# Patient Record
Sex: Female | Born: 1960 | Race: White | Hispanic: No
Health system: Southern US, Community
[De-identification: ages and names within clinical notes are randomized; demographics above are authoritative.]

## PROBLEM LIST (undated history)

## (undated) DIAGNOSIS — I609 Nontraumatic subarachnoid hemorrhage, unspecified: Secondary | ICD-10-CM

## (undated) DIAGNOSIS — I482 Chronic atrial fibrillation, unspecified: Secondary | ICD-10-CM

## (undated) DIAGNOSIS — J449 Chronic obstructive pulmonary disease, unspecified: Secondary | ICD-10-CM

## (undated) DIAGNOSIS — J9621 Acute and chronic respiratory failure with hypoxia: Secondary | ICD-10-CM

## (undated) DIAGNOSIS — I69391 Dysphagia following cerebral infarction: Secondary | ICD-10-CM

---

## 2018-02-09 ENCOUNTER — Other Ambulatory Visit (HOSPITAL_COMMUNITY): Payer: Medicare Other

## 2018-02-09 ENCOUNTER — Inpatient Hospital Stay
Admission: AD | Admit: 2018-02-09 | Discharge: 2018-03-13 | Disposition: A | Discharge status: Skilled Nursing Facility | Payer: Medicare Other | Source: Ambulatory Visit | Attending: Internal Medicine | Admitting: Internal Medicine

## 2018-02-09 ENCOUNTER — Inpatient Hospital Stay: Admission: AD | Admit: 2018-02-09 | Payer: Self-pay | Source: Ambulatory Visit | Admitting: Internal Medicine

## 2018-02-09 DIAGNOSIS — I609 Nontraumatic subarachnoid hemorrhage, unspecified: Secondary | ICD-10-CM

## 2018-02-09 DIAGNOSIS — J449 Chronic obstructive pulmonary disease, unspecified: Secondary | ICD-10-CM | POA: Diagnosis present

## 2018-02-09 DIAGNOSIS — I482 Chronic atrial fibrillation, unspecified: Secondary | ICD-10-CM | POA: Diagnosis present

## 2018-02-09 DIAGNOSIS — J189 Pneumonia, unspecified organism: Secondary | ICD-10-CM

## 2018-02-09 DIAGNOSIS — J9621 Acute and chronic respiratory failure with hypoxia: Secondary | ICD-10-CM | POA: Diagnosis present

## 2018-02-09 DIAGNOSIS — J9690 Respiratory failure, unspecified, unspecified whether with hypoxia or hypercapnia: Secondary | ICD-10-CM

## 2018-02-09 DIAGNOSIS — I69391 Dysphagia following cerebral infarction: Secondary | ICD-10-CM

## 2018-02-09 DIAGNOSIS — J4489 Other specified chronic obstructive pulmonary disease: Secondary | ICD-10-CM | POA: Diagnosis present

## 2018-02-09 DIAGNOSIS — Z431 Encounter for attention to gastrostomy: Secondary | ICD-10-CM

## 2018-02-09 HISTORY — DX: Dysphagia following cerebral infarction: I69.391

## 2018-02-09 HISTORY — DX: Nontraumatic subarachnoid hemorrhage, unspecified: I60.9

## 2018-02-09 HISTORY — DX: Acute and chronic respiratory failure with hypoxia: J96.21

## 2018-02-09 HISTORY — DX: Chronic obstructive pulmonary disease, unspecified: J44.9

## 2018-02-09 HISTORY — DX: Chronic atrial fibrillation, unspecified: I48.20

## 2018-02-09 MED ORDER — IOPAMIDOL (ISOVUE-300) INJECTION 61%
INTRAVENOUS | Status: AC
  Filled 2018-02-09: qty 50

## 2018-02-10 DIAGNOSIS — J449 Chronic obstructive pulmonary disease, unspecified: Secondary | ICD-10-CM

## 2018-02-10 DIAGNOSIS — J9621 Acute and chronic respiratory failure with hypoxia: Secondary | ICD-10-CM | POA: Diagnosis not present

## 2018-02-10 DIAGNOSIS — I609 Nontraumatic subarachnoid hemorrhage, unspecified: Secondary | ICD-10-CM

## 2018-02-10 DIAGNOSIS — I69391 Dysphagia following cerebral infarction: Secondary | ICD-10-CM

## 2018-02-10 DIAGNOSIS — I482 Chronic atrial fibrillation, unspecified: Secondary | ICD-10-CM | POA: Diagnosis not present

## 2018-02-10 LAB — CBC WITH DIFFERENTIAL/PLATELET
ABS IMMATURE GRANULOCYTES: 0.08 10*3/uL — AB (ref 0.00–0.07)
BASOS ABS: 0 10*3/uL (ref 0.0–0.1)
BASOS PCT: 0 %
Eosinophils Absolute: 0.2 10*3/uL (ref 0.0–0.5)
Eosinophils Relative: 2 %
HCT: 24.9 % — ABNORMAL LOW (ref 36.0–46.0)
Hemoglobin: 7.7 g/dL — ABNORMAL LOW (ref 12.0–15.0)
IMMATURE GRANULOCYTES: 1 %
Lymphocytes Relative: 17 %
Lymphs Abs: 1.6 10*3/uL (ref 0.7–4.0)
MCH: 28.8 pg (ref 26.0–34.0)
MCHC: 30.9 g/dL (ref 30.0–36.0)
MCV: 93.3 fL (ref 80.0–100.0)
Monocytes Absolute: 0.8 10*3/uL (ref 0.1–1.0)
Monocytes Relative: 8 %
NEUTROS ABS: 7.2 10*3/uL (ref 1.7–7.7)
NEUTROS PCT: 72 %
PLATELETS: 326 10*3/uL (ref 150–400)
RBC: 2.67 MIL/uL — AB (ref 3.87–5.11)
RDW: 14.2 % (ref 11.5–15.5)
WBC: 9.9 10*3/uL (ref 4.0–10.5)
nRBC: 0 % (ref 0.0–0.2)

## 2018-02-10 LAB — COMPREHENSIVE METABOLIC PANEL
ALBUMIN: 1.8 g/dL — AB (ref 3.5–5.0)
ALT: 33 U/L (ref 0–44)
ANION GAP: 5 (ref 5–15)
AST: 42 U/L — ABNORMAL HIGH (ref 15–41)
Alkaline Phosphatase: 58 U/L (ref 38–126)
BILIRUBIN TOTAL: 0.4 mg/dL (ref 0.3–1.2)
BUN: 11 mg/dL (ref 6–20)
CHLORIDE: 105 mmol/L (ref 98–111)
CO2: 27 mmol/L (ref 22–32)
Calcium: 7.8 mg/dL — ABNORMAL LOW (ref 8.9–10.3)
Creatinine, Ser: 0.43 mg/dL — ABNORMAL LOW (ref 0.44–1.00)
GFR calc Af Amer: >60 mL/min (ref >60)
GLUCOSE: 144 mg/dL — AB (ref 70–99)
POTASSIUM: 3.9 mmol/L (ref 3.5–5.1)
Sodium: 137 mmol/L (ref 135–145)
TOTAL PROTEIN: 5.1 g/dL — AB (ref 6.5–8.1)

## 2018-02-10 LAB — BLOOD GAS, ARTERIAL
ACID-BASE EXCESS: 5.8 mmol/L — AB (ref 0.0–2.0)
Bicarbonate: 29.1 mmol/L — ABNORMAL HIGH (ref 20.0–28.0)
FIO2: 28
O2 Saturation: 99 %
PEEP: 5 cmH2O
PH ART: 7.506 — AB (ref 7.350–7.450)
Patient temperature: 98.6
RATE: 14 resp/min
VT: 420 mL
pCO2 arterial: 37.1 mmHg (ref 32.0–48.0)
pO2, Arterial: 121 mmHg — ABNORMAL HIGH (ref 83.0–108.0)

## 2018-02-10 LAB — PROTIME-INR
INR: 1.23
PROTHROMBIN TIME: 15.4 s — AB (ref 11.4–15.2)

## 2018-02-10 LAB — C DIFFICILE QUICK SCREEN W PCR REFLEX
C DIFFICILE (CDIFF) TOXIN: NEGATIVE
C DIFFICLE (CDIFF) ANTIGEN: NEGATIVE
C Diff interpretation: NOT DETECTED

## 2018-02-10 MED ORDER — GENERIC EXTERNAL MEDICATION
40.00 | Status: DC
Start: 2018-02-10 — End: 2018-02-10

## 2018-02-10 MED ORDER — ALBUTEROL SULFATE (2.5 MG/3ML) 0.083% IN NEBU
2.50 | INHALATION_SOLUTION | RESPIRATORY_TRACT | Status: DC
Start: ? — End: 2018-02-10

## 2018-02-10 MED ORDER — GENERIC EXTERNAL MEDICATION
Status: DC
Start: 2018-02-10 — End: 2018-02-10

## 2018-02-10 MED ORDER — CHLORHEXIDINE GLUCONATE 0.12 % MT SOLN
15.00 | OROMUCOSAL | Status: DC
Start: ? — End: 2018-02-10

## 2018-02-10 MED ORDER — METOPROLOL TARTRATE 25 MG PO TABS
12.50 | ORAL_TABLET | ORAL | Status: DC
Start: 2018-02-09 — End: 2018-02-10

## 2018-02-10 MED ORDER — SODIUM CHLORIDE FLUSH 0.9 % IV SOLN
10.00 | INTRAVENOUS | Status: DC
Start: ? — End: 2018-02-10

## 2018-02-10 MED ORDER — REFRESH P.M. OP OINT
TOPICAL_OINTMENT | OPHTHALMIC | Status: DC
Start: ? — End: 2018-02-10

## 2018-02-10 MED ORDER — CHLORHEXIDINE GLUCONATE 0.12 % MT SOLN
15.00 | OROMUCOSAL | Status: DC
Start: 2018-02-09 — End: 2018-02-10

## 2018-02-10 MED ORDER — IPRATROPIUM-ALBUTEROL 0.5-2.5 (3) MG/3ML IN SOLN
3.00 | RESPIRATORY_TRACT | Status: DC
Start: 2018-02-09 — End: 2018-02-10

## 2018-02-10 MED ORDER — FENTANYL CITRATE (PF) 50 MCG/ML IJ SOLN
25.00 | INTRAMUSCULAR | Status: DC
Start: ? — End: 2018-02-10

## 2018-02-10 MED ORDER — ENALAPRILAT 1.25 MG/ML IV INJ
0.63 | INJECTION | INTRAVENOUS | Status: DC
Start: ? — End: 2018-02-10

## 2018-02-10 MED ORDER — GUAIFENESIN 100 MG/5ML PO SYRP
200.00 | ORAL_SOLUTION | ORAL | Status: DC
Start: ? — End: 2018-02-10

## 2018-02-10 MED ORDER — NUTRISOURCE FIBER PO PACK
1.00 | PACK | ORAL | Status: DC
Start: 2018-02-09 — End: 2018-02-10

## 2018-02-10 MED ORDER — BUDESONIDE 0.5 MG/2ML IN SUSP
0.50 | RESPIRATORY_TRACT | Status: DC
Start: 2018-02-09 — End: 2018-02-10

## 2018-02-10 MED ORDER — HYDRALAZINE HCL 20 MG/ML IJ SOLN
10.00 | INTRAMUSCULAR | Status: DC
Start: ? — End: 2018-02-10

## 2018-02-10 MED ORDER — LEVETIRACETAM 500 MG PO TABS
500.00 | ORAL_TABLET | ORAL | Status: DC
Start: 2018-02-09 — End: 2018-02-10

## 2018-02-10 MED ORDER — ACETAMINOPHEN 325 MG PO TABS
650.00 | ORAL_TABLET | ORAL | Status: DC
Start: ? — End: 2018-02-10

## 2018-02-10 MED ORDER — HEPARIN SODIUM (PORCINE) 5000 UNIT/ML IJ SOLN
5000.00 | INTRAMUSCULAR | Status: DC
Start: 2018-02-09 — End: 2018-02-10

## 2018-02-10 MED ORDER — ATORVASTATIN CALCIUM 10 MG PO TABS
20.00 | ORAL_TABLET | ORAL | Status: DC
Start: ? — End: 2018-02-10

## 2018-02-10 MED ORDER — GENERIC EXTERNAL MEDICATION
Status: DC
Start: ? — End: 2018-02-10

## 2018-02-10 MED ORDER — ASPIRIN 81 MG PO CHEW
81.00 | CHEWABLE_TABLET | ORAL | Status: DC
Start: 2018-02-10 — End: 2018-02-10

## 2018-02-10 MED ORDER — LABETALOL HCL 5 MG/ML IV SOLN
10.00 | INTRAVENOUS | Status: DC
Start: ? — End: 2018-02-10

## 2018-02-10 NOTE — Consult Note (Signed)
Pulmonary Critical Care Medicine [facility] [place]  PULMONARY SERVICE  Date of Service: 02/10/2018  PULMONARY CRITICAL CARE CONSULT   [name]  ZOX:[id number]  DOB: 08/30/1960   DOA: 02/09/2018  Referring Physician: Carron Curie, MD  HPI: Evelyn Wilson is a 57 y.o. female seen for follow up of Acute on Chronic Respiratory Failure.  Patient has multiple medical problems including chronic atrial fibrillation COPD hyperlipidemia hypertension TIA presented to the hospital after an acute stroke in October and was noted to have a subdural hemorrhage and subarachnoid hemorrhage.  Patient had quadriparesis and noted at that time also.  During the hospital stay patient had multifocal acute strokes and it was felt to be due to embolic phenomenon.  Patient does have as mentioned atrial fibrillation.  Because of this and because of need for airway protection patient was intubated and she underwent a tracheostomy subsequently as well as a PEG for prolonged mechanical ventilation she now presents to our facility for further weaning currently on pressure support mode.  Review of Systems:  ROS performed and is unremarkable other than noted above.  Past Medical History:  Diagnosis Date  . Arthritis  . Chronic bronchitis (HCC)  . COPD (chronic obstructive pulmonary disease) (HCC)  . Heart disease  . High cholesterol  . Hypertension  . Lung disease  . Stroke [facility])   Past Surgical History Past Surgical History:  Procedure Laterality Date  . APPENDECTOMY  . BREAST BIOPSY Left  b9  . CHOLECYSTECTOMY  . DILATION AND CURETTAGE OF UTERUS  . HYSTERECTOMY  . OOPHORECTOMY Bilateral   Family History Family History  Problem Relation Age of Onset  . Cancer Father 70  Lung cancer, smoker.  . Arthritis Father  . Heart disease Father  . High Cholesterol Father  . Hypertension Father  . Arthritis Mother  . Cancer Mother  . Heart disease Mother  . High Cholesterol Mother  . Hypertension  Mother  . Stroke Mother  . Ovarian cancer Mother 47  . Arthritis Sister  . Diabetes Sister  . Hypertension Sister  . Arthritis Brother  . Diabetes Brother  . Breast cancer Other   Social History Social History   Socioeconomic History  . Marital status: Divorced  Spouse name: Not on file  . Number of children: Not on file  . Years of education: Not on file  . Highest education level: Not on file  Occupational History  . Not on file  Social Needs  . Financial resource strain: Not on file  . Food insecurity:  Worry: Not on file  Inability: Not on file  . Transportation needs:  Medical: Not on file  Non-medical: Not on file  Tobacco Use  . Smoking status: Current Every Day Smoker  Packs/day: 0.50  Years: 30.00  Pack years: 15.00  Last attempt to quit: 05/22/2012  Years since quitting: 5.6  . Smokeless tobacco: Never Used  . Tobacco comment: on here and there  Substance and Sexual Activity  . Alcohol use: No  . Drug use: No     Medications: Reviewed on Rounds  Physical Exam:  Vitals: Temperature 98.9 pulse 99 respiratory rate 18 blood pressure 135/60 saturations 98%  Ventilator Settings mode of ventilation is pressure support FiO2 28% tidal volume 400 PEEP 5  . General: Comfortable at this time . Eyes: Grossly normal lids, irises & conjunctiva . ENT: grossly tongue is normal . Neck: no obvious mass . Cardiovascular: S1-S2 normal no gallop or rub . Respiratory: Coarse  breath sounds with few rhonchi . Abdomen: Soft and nontender . Skin: no rash seen on limited exam . Musculoskeletal: not rigid . Psychiatric:unable to assess . Neurologic: no seizure no involuntary movements         Labs on Admission:  Basic Metabolic Panel: Recent Labs  Lab 02/10/18 0630  NA 137  K 3.9  CL 105  CO2 27  GLUCOSE 144*  BUN 11  CREATININE 0.43*  CALCIUM 7.8*    Recent Labs  Lab 02/09/18 1541  PHART 7.506*  PCO2ART 37.1  PO2ART 121*  HCO3 29.1*  O2SAT 99.0     Liver Function Tests: Recent Labs  Lab 02/10/18 0630  AST 42*  ALT 33  ALKPHOS 58  BILITOT 0.4  PROT 5.1*  ALBUMIN 1.8*   No results for input(s): LIPASE, AMYLASE in the last 168 hours. No results for input(s): AMMONIA in the last 168 hours.  CBC: Recent Labs  Lab 02/10/18 0630  WBC 9.9  NEUTROABS 7.2  HGB 7.7*  HCT 24.9*  MCV 93.3  PLT 326    Cardiac Enzymes: No results for input(s): CKTOTAL, CKMB, CKMBINDEX, TROPONINI in the last 168 hours.  BNP (last 3 results) No results for input(s): BNP in the last 8760 hours.  ProBNP (last 3 results) No results for input(s): PROBNP in the last 8760 hours.   Radiological Exams on Admission: Dg Abdomen Peg Tube Location  Result Date: 02/09/2018 CLINICAL DATA:  Peg tube placement. EXAM: ABDOMEN - 1 VIEW COMPARISON:  None. FINDINGS: The feeding gastrostomy tube is in the body region of the stomach. No leaking contrast is identified. IMPRESSION: Well positioned PEG tube in the body region of the stomach. Electronically Signed   By: Rudie Meyer M.D.   On: 02/09/2018 17:49   Dg Chest Port 1 View  Result Date: 02/09/2018 CLINICAL DATA:  Respiratory failure. EXAM: PORTABLE CHEST 1 VIEW COMPARISON:  None. FINDINGS: The tracheostomy tube is in good position. A loop recorder is noted. The cardiac silhouette, mediastinal and hilar contours appear normal. Apical scarring changes noted. No definite infiltrates or edema. Possible small left effusion the bony thorax is intact. IMPRESSION: Tracheostomy tube in good position. No acute pulmonary findings.  Possible small left effusion. Electronically Signed   By: Rudie Meyer M.D.   On: 02/09/2018 17:51    Assessment/Plan Active Problems:   Acute on chronic respiratory failure with hypoxia (HCC)   Nontraumatic subarachnoid hemorrhage (HCC)   Chronic atrial fibrillation   Obstructive chronic bronchitis without exacerbation (HCC)   Dysphagia as late effect of cerebrovascular accident  (CVA)   1. Acute on chronic respiratory failure with hypoxia we will continue with the weaning process.  Patient will be started on protocol and advance as tolerated.  Continue to titrate oxygen and pulmonary toilet supportive care. 2. Nontraumatic subarachnoid hemorrhage subdural hematoma we will continue with supportive care physical therapy as tolerated. 3. Chronic atrial fibrillation rate is controlled at this time off anticoagulation. 4. Chronic obstructive pulmonary disease stable we will continue to follow along. 5. Dysphagia will need evaluation by speech therapy continue with supportive care.  I have personally seen and evaluated the patient, evaluated laboratory and imaging results, formulated the assessment and plan and placed orders. The Patient requires high complexity decision making for assessment and support.  Case was discussed on Rounds with the Respiratory Therapy Staff Time Spent  Yevonne Pax, MD [facility] Pulmonary Critical Care Medicine Sleep Medicine

## 2018-02-12 ENCOUNTER — Encounter: Payer: Self-pay | Admitting: Internal Medicine

## 2018-02-12 DIAGNOSIS — J449 Chronic obstructive pulmonary disease, unspecified: Secondary | ICD-10-CM | POA: Diagnosis present

## 2018-02-12 DIAGNOSIS — I609 Nontraumatic subarachnoid hemorrhage, unspecified: Secondary | ICD-10-CM | POA: Diagnosis not present

## 2018-02-12 DIAGNOSIS — I482 Chronic atrial fibrillation, unspecified: Secondary | ICD-10-CM | POA: Diagnosis present

## 2018-02-12 DIAGNOSIS — I69391 Dysphagia following cerebral infarction: Secondary | ICD-10-CM

## 2018-02-12 DIAGNOSIS — J9621 Acute and chronic respiratory failure with hypoxia: Secondary | ICD-10-CM | POA: Diagnosis present

## 2018-02-12 NOTE — Progress Notes (Signed)
Pulmonary Critical Care Medicine [facility] [place]   PULMONARY CRITICAL CARE SERVICE  PROGRESS NOTE  Date of Service: 02/12/2018  Evelyn Wilson  [id number]  DOB: [birth year]   DOA: 02/09/2018  Referring Physician: Carron Curie, MD  HPI: Evelyn Wilson is a 57 y.o. female seen for follow up of Acute on Chronic Respiratory Failure.  Right now is on pressure support mode on weaning doing fairly well good volumes are noted at this time.  Medications: Reviewed on Rounds  Physical Exam:  Vitals: Temperature 98.2 pulse 80 respiratory rate 22 blood pressure 137/70 saturations 100%  Ventilator Settings mode of ventilation pressure support FiO2 28% tidal volume 424 pressure support 12 PEEP 5  . General: Comfortable at this time . Eyes: Grossly normal lids, irises & conjunctiva . ENT: grossly tongue is normal . Neck: no obvious mass . Cardiovascular: S1 S2 normal no gallop . Respiratory: No rhonchi or rales are noted at this time . Abdomen: soft . Skin: no rash seen on limited exam . Musculoskeletal: not rigid . Psychiatric:unable to assess . Neurologic: no seizure no involuntary movements         Lab Data:   Basic Metabolic Panel: Recent Labs  Lab 02/10/18 0630  NA 137  K 3.9  CL 105  CO2 27  GLUCOSE 144*  BUN 11  CREATININE 0.43*  CALCIUM 7.8*    ABG: Recent Labs  Lab 02/09/18 1541  PHART 7.506*  PCO2ART 37.1  PO2ART 121*  HCO3 29.1*  O2SAT 99.0    Liver Function Tests: Recent Labs  Lab 02/10/18 0630  AST 42*  ALT 33  ALKPHOS 58  BILITOT 0.4  PROT 5.1*  ALBUMIN 1.8*   No results for input(s): LIPASE, AMYLASE in the last 168 hours. No results for input(s): AMMONIA in the last 168 hours.  CBC: Recent Labs  Lab 02/10/18 0630  WBC 9.9  NEUTROABS 7.2  HGB 7.7*  HCT 24.9*  MCV 93.3  PLT 326    Cardiac Enzymes: No results for input(s): CKTOTAL, CKMB, CKMBINDEX, TROPONINI in the last 168 hours.  BNP (last 3 results) No results for  input(s): BNP in the last 8760 hours.  ProBNP (last 3 results) No results for input(s): PROBNP in the last 8760 hours.  Radiological Exams: No results found.  Assessment/Plan Active Problems:   Acute on chronic respiratory failure with hypoxia (HCC)   Nontraumatic subarachnoid hemorrhage (HCC)   Chronic atrial fibrillation   Obstructive chronic bronchitis without exacerbation (HCC)   Dysphagia as late effect of cerebrovascular accident (CVA)   1. Acute on chronic respiratory failure with hypoxia we will continue with pressure support continue secretion management pulmonary toilet.  Patient is tolerating pressure support well 2. Non-traumatic subarachnoid hemorrhage at baseline continue present management 3. Chronic atrial fibrillation rate is controlled 4. COPD stable continue supportive care 5. Dysphagia speech therapy to follow   I have personally seen and evaluated the patient, evaluated laboratory and imaging results, formulated the assessment and plan and placed orders. The Patient requires high complexity decision making for assessment and support.  Case was discussed on Rounds with the Respiratory Therapy Staff  Yevonne Pax, MD [facility] Pulmonary Critical Care Medicine Sleep Medicine

## 2018-02-13 DIAGNOSIS — I69391 Dysphagia following cerebral infarction: Secondary | ICD-10-CM | POA: Diagnosis not present

## 2018-02-13 DIAGNOSIS — I482 Chronic atrial fibrillation, unspecified: Secondary | ICD-10-CM | POA: Diagnosis not present

## 2018-02-13 DIAGNOSIS — I609 Nontraumatic subarachnoid hemorrhage, unspecified: Secondary | ICD-10-CM | POA: Diagnosis not present

## 2018-02-13 DIAGNOSIS — J9621 Acute and chronic respiratory failure with hypoxia: Secondary | ICD-10-CM | POA: Diagnosis not present

## 2018-02-13 NOTE — Progress Notes (Signed)
Pulmonary Critical Care Medicine [facility] [place]   PULMONARY CRITICAL CARE SERVICE  PROGRESS NOTE  Date of Service: 02/13/2018  Evelyn Wilson  [id number]  DOB: [birth year]   DOA: 02/09/2018  Referring Physician: Carron Curie, MD  HPI: Evelyn Wilson is a 57 y.o. female seen for follow up of Acute on Chronic Respiratory Failure.  Patient is currently on T collar has been on 28% FiO2 the goal is to try for about 2 hours.  Medications: Reviewed on Rounds  Physical Exam:  Vitals: Temperature 98.3 pulse 90 respiratory 19 blood pressure 149/69 saturations 100%  Ventilator Settings off the ventilator right now on T collar  . General: Comfortable at this time . Eyes: Grossly normal lids, irises & conjunctiva . ENT: grossly tongue is normal . Neck: no obvious mass . Cardiovascular: S1 S2 normal no gallop . Respiratory: Coarse breath sounds without rhonchi . Abdomen: soft . Skin: no rash seen on limited exam . Musculoskeletal: not rigid . Psychiatric:unable to assess . Neurologic: no seizure no involuntary movements         Lab Data:   Basic Metabolic Panel: Recent Labs  Lab 02/10/18 0630  NA 137  K 3.9  CL 105  CO2 27  GLUCOSE 144*  BUN 11  CREATININE 0.43*  CALCIUM 7.8*    ABG: Recent Labs  Lab 02/09/18 1541  PHART 7.506*  PCO2ART 37.1  PO2ART 121*  HCO3 29.1*  O2SAT 99.0    Liver Function Tests: Recent Labs  Lab 02/10/18 0630  AST 42*  ALT 33  ALKPHOS 58  BILITOT 0.4  PROT 5.1*  ALBUMIN 1.8*   No results for input(s): LIPASE, AMYLASE in the last 168 hours. No results for input(s): AMMONIA in the last 168 hours.  CBC: Recent Labs  Lab 02/10/18 0630  WBC 9.9  NEUTROABS 7.2  HGB 7.7*  HCT 24.9*  MCV 93.3  PLT 326    Cardiac Enzymes: No results for input(s): CKTOTAL, CKMB, CKMBINDEX, TROPONINI in the last 168 hours.  BNP (last 3 results) No results for input(s): BNP in the last 8760 hours.  ProBNP (last 3 results) No  results for input(s): PROBNP in the last 8760 hours.  Radiological Exams: No results found.  Assessment/Plan Active Problems:   Acute on chronic respiratory failure with hypoxia (HCC)   Nontraumatic subarachnoid hemorrhage (HCC)   Chronic atrial fibrillation   Obstructive chronic bronchitis without exacerbation (HCC)   Dysphagia as late effect of cerebrovascular accident (CVA)   1. Acute on chronic respiratory failure with hypoxia we will continue with T collar wean continue pulmonary toilet secretion management. 2. Nontraumatic subarachnoid hemorrhage she is at baseline therapy as tolerated. 3. Chronic atrial fibrillation rate is controlled 4. COPD continue with present management prognosis guarded. 5. Dysphagia physical therapy to follow speech therapy to follow   I have personally seen and evaluated the patient, evaluated laboratory and imaging results, formulated the assessment and plan and placed orders. The Patient requires high complexity decision making for assessment and support.  Case was discussed on Rounds with the Respiratory Therapy Staff  Yevonne Pax, MD [facility] Pulmonary Critical Care Medicine Sleep Medicine

## 2018-02-14 DIAGNOSIS — J9621 Acute and chronic respiratory failure with hypoxia: Secondary | ICD-10-CM | POA: Diagnosis not present

## 2018-02-14 DIAGNOSIS — I69391 Dysphagia following cerebral infarction: Secondary | ICD-10-CM | POA: Diagnosis not present

## 2018-02-14 DIAGNOSIS — I482 Chronic atrial fibrillation, unspecified: Secondary | ICD-10-CM | POA: Diagnosis not present

## 2018-02-14 DIAGNOSIS — I609 Nontraumatic subarachnoid hemorrhage, unspecified: Secondary | ICD-10-CM | POA: Diagnosis not present

## 2018-02-14 NOTE — Progress Notes (Signed)
Pulmonary Critical Care Medicine [facility] [place]   PULMONARY CRITICAL CARE SERVICE  PROGRESS NOTE  Date of Service: 02/14/2018  Evelyn Wilson  [id number]  DOB: 07-17-1960   DOA: 02/09/2018  Referring Physician: Carron Curie, MD  HPI: Evelyn Wilson is a 57 y.o. female seen for follow up of Acute on Chronic Respiratory Failure.  Currently patient is on T collar has been weaning the goal is to try to continue beyond 4 hours  Medications: Reviewed on Rounds  Physical Exam:  Vitals: Temperature 98.0 pulse 73 respiratory rate 23 blood pressure 98/47 saturations are 98%  Ventilator Settings on T collar FiO2 28%  . General: Comfortable at this time . Eyes: Grossly normal lids, irises & conjunctiva . ENT: grossly tongue is normal . Neck: no obvious mass . Cardiovascular: S1 S2 normal no gallop . Respiratory: No rhonchi or rales are noted . Abdomen: soft . Skin: no rash seen on limited exam . Musculoskeletal: not rigid . Psychiatric:unable to assess . Neurologic: no seizure no involuntary movements         Lab Data:   Basic Metabolic Panel: Recent Labs  Lab 02/10/18 0630  NA 137  K 3.9  CL 105  CO2 27  GLUCOSE 144*  BUN 11  CREATININE 0.43*  CALCIUM 7.8*    ABG: Recent Labs  Lab 02/09/18 1541  PHART 7.506*  PCO2ART 37.1  PO2ART 121*  HCO3 29.1*  O2SAT 99.0    Liver Function Tests: Recent Labs  Lab 02/10/18 0630  AST 42*  ALT 33  ALKPHOS 58  BILITOT 0.4  PROT 5.1*  ALBUMIN 1.8*   No results for input(s): LIPASE, AMYLASE in the last 168 hours. No results for input(s): AMMONIA in the last 168 hours.  CBC: Recent Labs  Lab 02/10/18 0630  WBC 9.9  NEUTROABS 7.2  HGB 7.7*  HCT 24.9*  MCV 93.3  PLT 326    Cardiac Enzymes: No results for input(s): CKTOTAL, CKMB, CKMBINDEX, TROPONINI in the last 168 hours.  BNP (last 3 results) No results for input(s): BNP in the last 8760 hours.  ProBNP (last 3 results) No results for  input(s): PROBNP in the last 8760 hours.  Radiological Exams: No results found.  Assessment/Plan Active Problems:   Acute on chronic respiratory failure with hypoxia (HCC)   Nontraumatic subarachnoid hemorrhage (HCC)   Chronic atrial fibrillation   Obstructive chronic bronchitis without exacerbation (HCC)   Dysphagia as late effect of cerebrovascular accident (CVA)   1. Acute on chronic respiratory failure with hypoxia continue to wean on T collar goal is to go beyond 4 hours 2. Nontraumatic subarachnoid hemorrhage unchanged stable 3. Chronic atrial fibrillation rate controlled 4. COPD severe disease continue present management 5. Dysphagia stable at this time   I have personally seen and evaluated the patient, evaluated laboratory and imaging results, formulated the assessment and plan and placed orders. The Patient requires high complexity decision making for assessment and support.  Case was discussed on Rounds with the Respiratory Therapy Staff  Yevonne Pax, MD [facility] Pulmonary Critical Care Medicine Sleep Medicine

## 2018-02-15 DIAGNOSIS — I482 Chronic atrial fibrillation, unspecified: Secondary | ICD-10-CM | POA: Diagnosis not present

## 2018-02-15 DIAGNOSIS — I609 Nontraumatic subarachnoid hemorrhage, unspecified: Secondary | ICD-10-CM | POA: Diagnosis not present

## 2018-02-15 DIAGNOSIS — I69391 Dysphagia following cerebral infarction: Secondary | ICD-10-CM | POA: Diagnosis not present

## 2018-02-15 DIAGNOSIS — J9621 Acute and chronic respiratory failure with hypoxia: Secondary | ICD-10-CM | POA: Diagnosis not present

## 2018-02-15 NOTE — Progress Notes (Signed)
Pulmonary Critical Care Medicine [facility] [place]   PULMONARY CRITICAL CARE SERVICE  PROGRESS NOTE  Date of Service: 02/15/2018  Evelyn Wilson  [id number]  DOB: 1960/09/06   DOA: 02/09/2018  Referring Physician: Carron Curie, MD  HPI: Evelyn Wilson is a 57 y.o. female seen for follow up of Acute on Chronic Respiratory Failure.  Patient is on T collar at this time currently goal is 12 hours  Medications: Reviewed on Rounds  Physical Exam:  Vitals: Temperature 97.4 pulse 68 respiratory rate 20 blood pressure 140/80 saturations 98%  Ventilator Settings off the ventilator weaning on T collar  . General: Comfortable at this time . Eyes: Grossly normal lids, irises & conjunctiva . ENT: grossly tongue is normal . Neck: no obvious mass . Cardiovascular: S1 S2 normal no gallop . Respiratory: Coarse breath sounds no rhonchi . Abdomen: soft . Skin: no rash seen on limited exam . Musculoskeletal: not rigid . Psychiatric:unable to assess . Neurologic: no seizure no involuntary movements         Lab Data:   Basic Metabolic Panel: Recent Labs  Lab 02/10/18 0630  NA 137  K 3.9  CL 105  CO2 27  GLUCOSE 144*  BUN 11  CREATININE 0.43*  CALCIUM 7.8*    ABG: Recent Labs  Lab 02/09/18 1541  PHART 7.506*  PCO2ART 37.1  PO2ART 121*  HCO3 29.1*  O2SAT 99.0    Liver Function Tests: Recent Labs  Lab 02/10/18 0630  AST 42*  ALT 33  ALKPHOS 58  BILITOT 0.4  PROT 5.1*  ALBUMIN 1.8*   No results for input(s): LIPASE, AMYLASE in the last 168 hours. No results for input(s): AMMONIA in the last 168 hours.  CBC: Recent Labs  Lab 02/10/18 0630  WBC 9.9  NEUTROABS 7.2  HGB 7.7*  HCT 24.9*  MCV 93.3  PLT 326    Cardiac Enzymes: No results for input(s): CKTOTAL, CKMB, CKMBINDEX, TROPONINI in the last 168 hours.  BNP (last 3 results) No results for input(s): BNP in the last 8760 hours.  ProBNP (last 3 results) No results for input(s): PROBNP in  the last 8760 hours.  Radiological Exams: No results found.  Assessment/Plan Active Problems:   Acute on chronic respiratory failure with hypoxia (HCC)   Nontraumatic subarachnoid hemorrhage (HCC)   Chronic atrial fibrillation   Obstructive chronic bronchitis without exacerbation (HCC)   Dysphagia as late effect of cerebrovascular accident (CVA)   1. Acute on chronic respiratory failure with hypoxia we will continue to wean on T collar trials.  Continue pulmonary toilet secretion management. 2. Nontraumatic subarachnoid hemorrhage at baseline continue to monitor 3. Chronic atrial fibrillation rate is controlled 4. COPD severe disease 5. Dysphasia stable we will monitor   I have personally seen and evaluated the patient, evaluated laboratory and imaging results, formulated the assessment and plan and placed orders. The Patient requires high complexity decision making for assessment and support.  Case was discussed on Rounds with the Respiratory Therapy Staff  Yevonne Pax, MD [facility] Pulmonary Critical Care Medicine Sleep Medicine

## 2018-02-16 DIAGNOSIS — I69391 Dysphagia following cerebral infarction: Secondary | ICD-10-CM | POA: Diagnosis not present

## 2018-02-16 DIAGNOSIS — I609 Nontraumatic subarachnoid hemorrhage, unspecified: Secondary | ICD-10-CM | POA: Diagnosis not present

## 2018-02-16 DIAGNOSIS — J9621 Acute and chronic respiratory failure with hypoxia: Secondary | ICD-10-CM | POA: Diagnosis not present

## 2018-02-16 DIAGNOSIS — I482 Chronic atrial fibrillation, unspecified: Secondary | ICD-10-CM | POA: Diagnosis not present

## 2018-02-16 NOTE — Progress Notes (Signed)
Pulmonary Critical Care Medicine [facility] [place]   PULMONARY CRITICAL CARE SERVICE  PROGRESS NOTE  Date of Service: 02/16/2018  Evelyn Wilson  [id number]  DOB: [birth year]   DOA: 02/09/2018  Referring Physician: Carron Curie, MD  HPI: Evelyn Wilson is a 57 y.o. female seen for follow up of Acute on Chronic Respiratory Failure.  Patient is on T collar has been on 28% FiO2 with a goal of 16 hours  Medications: Reviewed on Rounds  Physical Exam:  Vitals: Temperature 97.2 pulse 81 respiratory rate 15 blood pressure 129/65 saturations 91%  Ventilator Settings currently is on T collar  . General: Comfortable at this time . Eyes: Grossly normal lids, irises & conjunctiva . ENT: grossly tongue is normal . Neck: no obvious mass . Cardiovascular: S1 S2 normal no gallop . Respiratory: No rhonchi or rales are noted . Abdomen: soft . Skin: no rash seen on limited exam . Musculoskeletal: not rigid . Psychiatric:unable to assess . Neurologic: no seizure no involuntary movements         Lab Data:   Basic Metabolic Panel: Recent Labs  Lab 02/10/18 0630  NA 137  K 3.9  CL 105  CO2 27  GLUCOSE 144*  BUN 11  CREATININE 0.43*  CALCIUM 7.8*    ABG: No results for input(s): PHART, PCO2ART, PO2ART, HCO3, O2SAT in the last 168 hours.  Liver Function Tests: Recent Labs  Lab 02/10/18 0630  AST 42*  ALT 33  ALKPHOS 58  BILITOT 0.4  PROT 5.1*  ALBUMIN 1.8*   No results for input(s): LIPASE, AMYLASE in the last 168 hours. No results for input(s): AMMONIA in the last 168 hours.  CBC: Recent Labs  Lab 02/10/18 0630  WBC 9.9  NEUTROABS 7.2  HGB 7.7*  HCT 24.9*  MCV 93.3  PLT 326    Cardiac Enzymes: No results for input(s): CKTOTAL, CKMB, CKMBINDEX, TROPONINI in the last 168 hours.  BNP (last 3 results) No results for input(s): BNP in the last 8760 hours.  ProBNP (last 3 results) No results for input(s): PROBNP in the last 8760  hours.  Radiological Exams: No results found.  Assessment/Plan Active Problems:   Acute on chronic respiratory failure with hypoxia (HCC)   Nontraumatic subarachnoid hemorrhage (HCC)   Chronic atrial fibrillation   Obstructive chronic bronchitis without exacerbation (HCC)   Dysphagia as late effect of cerebrovascular accident (CVA)   1. Acute on chronic respiratory failure with hypoxia continue weaning on T collar 16-hour goal 2. Nontraumatic subarachnoid hemorrhage unchanged we will continue with supportive care 3. Chronic atrial fibrillation rate is controlled 4. COPD at baseline continue supportive care 5. Dysphagia follow-up with speech   I have personally seen and evaluated the patient, evaluated laboratory and imaging results, formulated the assessment and plan and placed orders. The Patient requires high complexity decision making for assessment and support.  Case was discussed on Rounds with the Respiratory Therapy Staff  Yevonne Pax, MD [facility] Pulmonary Critical Care Medicine Sleep Medicine

## 2018-02-17 DIAGNOSIS — J9621 Acute and chronic respiratory failure with hypoxia: Secondary | ICD-10-CM | POA: Diagnosis not present

## 2018-02-17 DIAGNOSIS — I69391 Dysphagia following cerebral infarction: Secondary | ICD-10-CM | POA: Diagnosis not present

## 2018-02-17 DIAGNOSIS — I482 Chronic atrial fibrillation, unspecified: Secondary | ICD-10-CM | POA: Diagnosis not present

## 2018-02-17 DIAGNOSIS — I609 Nontraumatic subarachnoid hemorrhage, unspecified: Secondary | ICD-10-CM | POA: Diagnosis not present

## 2018-02-17 NOTE — Progress Notes (Signed)
Pulmonary Critical Care Medicine [facility] [place]   PULMONARY CRITICAL CARE SERVICE  PROGRESS NOTE  Date of Service: 02/17/2018  Evelyn Wilson  ZOX:[id number]  DOB: Apr 17, 1960   DOA: 02/09/2018  Referring Physician: Carron Curie, MD  HPI: Evelyn Wilson is a 57 y.o. female seen for follow up of Acute on Chronic Respiratory Failure.  Currently is on T collar with no distress has been on 28% FiO2 good saturations are noted.  Medications: Reviewed on Rounds  Physical Exam:  Vitals: Temperature 98.9 pulse 80 respiratory rate 17 blood pressure 138/58 saturations 96%  Ventilator Settings off the ventilator on T collar  . General: Comfortable at this time . Eyes: Grossly normal lids, irises & conjunctiva . ENT: grossly tongue is normal . Neck: no obvious mass . Cardiovascular: S1 S2 normal no gallop . Respiratory: Coarse breath sounds no rhonchi . Abdomen: soft . Skin: no rash seen on limited exam . Musculoskeletal: not rigid . Psychiatric:unable to assess . Neurologic: no seizure no involuntary movements         Lab Data:   Basic Metabolic Panel: No results for input(s): NA, K, CL, CO2, GLUCOSE, BUN, CREATININE, CALCIUM, MG, PHOS in the last 168 hours.  ABG: No results for input(s): PHART, PCO2ART, PO2ART, HCO3, O2SAT in the last 168 hours.  Liver Function Tests: No results for input(s): AST, ALT, ALKPHOS, BILITOT, PROT, ALBUMIN in the last 168 hours. No results for input(s): LIPASE, AMYLASE in the last 168 hours. No results for input(s): AMMONIA in the last 168 hours.  CBC: No results for input(s): WBC, NEUTROABS, HGB, HCT, MCV, PLT in the last 168 hours.  Cardiac Enzymes: No results for input(s): CKTOTAL, CKMB, CKMBINDEX, TROPONINI in the last 168 hours.  BNP (last 3 results) No results for input(s): BNP in the last 8760 hours.  ProBNP (last 3 results) No results for input(s): PROBNP in the last 8760 hours.  Radiological Exams: No results  found.  Assessment/Plan Active Problems:   Acute on chronic respiratory failure with hypoxia (HCC)   Nontraumatic subarachnoid hemorrhage (HCC)   Chronic atrial fibrillation   Obstructive chronic bronchitis without exacerbation (HCC)   Dysphagia as late effect of cerebrovascular accident (CVA)   1. Acute on chronic respiratory failure with hypoxia we will continue with the weaning on T collar trials continue secretion management pulmonary toilet. 2. Nontraumatic subarachnoid hemorrhage grossly unchanged we will continue present therapy 3. Chronic atrial fibrillation rate is controlled 4. COPD at baseline continue to monitor 5. Dysphasia with speech   I have personally seen and evaluated the patient, evaluated laboratory and imaging results, formulated the assessment and plan and placed orders. The Patient requires high complexity decision making for assessment and support.  Case was discussed on Rounds with the Respiratory Therapy Staff  Yevonne Pax, MD [facility] Pulmonary Critical Care Medicine Sleep Medicine

## 2018-02-18 DIAGNOSIS — J9621 Acute and chronic respiratory failure with hypoxia: Secondary | ICD-10-CM | POA: Diagnosis not present

## 2018-02-18 DIAGNOSIS — I609 Nontraumatic subarachnoid hemorrhage, unspecified: Secondary | ICD-10-CM | POA: Diagnosis not present

## 2018-02-18 DIAGNOSIS — I69391 Dysphagia following cerebral infarction: Secondary | ICD-10-CM | POA: Diagnosis not present

## 2018-02-18 DIAGNOSIS — I482 Chronic atrial fibrillation, unspecified: Secondary | ICD-10-CM | POA: Diagnosis not present

## 2018-02-18 LAB — BASIC METABOLIC PANEL
Anion gap: 8 (ref 5–15)
BUN: 16 mg/dL (ref 6–20)
CALCIUM: 8.8 mg/dL — AB (ref 8.9–10.3)
CO2: 29 mmol/L (ref 22–32)
CREATININE: 0.61 mg/dL (ref 0.44–1.00)
Chloride: 101 mmol/L (ref 98–111)
Glucose, Bld: 145 mg/dL — ABNORMAL HIGH (ref 70–99)
Potassium: 4.5 mmol/L (ref 3.5–5.1)
SODIUM: 138 mmol/L (ref 135–145)

## 2018-02-18 LAB — CBC
HCT: 35.9 % — ABNORMAL LOW (ref 36.0–46.0)
Hemoglobin: 10.6 g/dL — ABNORMAL LOW (ref 12.0–15.0)
MCH: 27.5 pg (ref 26.0–34.0)
MCHC: 29.5 g/dL — ABNORMAL LOW (ref 30.0–36.0)
MCV: 93 fL (ref 80.0–100.0)
NRBC: 0 % (ref 0.0–0.2)
PLATELETS: 347 10*3/uL (ref 150–400)
RBC: 3.86 MIL/uL — ABNORMAL LOW (ref 3.87–5.11)
RDW: 14.5 % (ref 11.5–15.5)
WBC: 10.7 10*3/uL — AB (ref 4.0–10.5)

## 2018-02-18 NOTE — Progress Notes (Signed)
Pulmonary Critical Care Medicine [facility] [place]   PULMONARY CRITICAL CARE SERVICE  PROGRESS NOTE  Date of Service: 02/18/2018  Evelyn Wilson  ZOX:[id number]  DOB: [birth year]   DOA: 02/09/2018  Referring Physician: Carron Curie, MD  HPI: Evelyn Wilson is a 58 y.o. female seen for follow up of Acute on Chronic Respiratory Failure.  Patient has been off the ventilator for a goal of more than 24 hours right now is on T collar no distress  Medications: Reviewed on Rounds  Physical Exam:  Vitals: Temperature 98.5 pulse 82 respiratory 21 blood pressure 122/61 saturations 98%  Ventilator Settings of the bilateral antecubital  . General: Comfortable at this time . Eyes: Grossly normal lids, irises & conjunctiva . ENT: grossly tongue is normal . Neck: no obvious mass . Cardiovascular: S1 S2 normal no gallop . Respiratory: Coarse breath sounds no rhonchi . Abdomen: soft . Skin: no rash seen on limited exam . Musculoskeletal: not rigid . Psychiatric:unable to assess . Neurologic: no seizure no involuntary movements         Lab Data:   Basic Metabolic Panel: Recent Labs  Lab 02/18/18 0629  NA 138  K 4.5  CL 101  CO2 29  GLUCOSE 145*  BUN 16  CREATININE 0.61  CALCIUM 8.8*    ABG: No results for input(s): PHART, PCO2ART, PO2ART, HCO3, O2SAT in the last 168 hours.  Liver Function Tests: No results for input(s): AST, ALT, ALKPHOS, BILITOT, PROT, ALBUMIN in the last 168 hours. No results for input(s): LIPASE, AMYLASE in the last 168 hours. No results for input(s): AMMONIA in the last 168 hours.  CBC: Recent Labs  Lab 02/18/18 0629  WBC 10.7*  HGB 10.6*  HCT 35.9*  MCV 93.0  PLT 347    Cardiac Enzymes: No results for input(s): CKTOTAL, CKMB, CKMBINDEX, TROPONINI in the last 168 hours.  BNP (last 3 results) No results for input(s): BNP in the last 8760 hours.  ProBNP (last 3 results) No results for input(s): PROBNP in the last 8760  hours.  Radiological Exams: No results found.  Assessment/Plan Active Problems:   Acute on chronic respiratory failure with hypoxia (HCC)   Nontraumatic subarachnoid hemorrhage (HCC)   Chronic atrial fibrillation   Obstructive chronic bronchitis without exacerbation (HCC)   Dysphagia as late effect of cerebrovascular accident (CVA)   1. Acute on chronic respiratory failure hypoxia we will continue weaning goal 24 hours 2. Currently atrial fibrillation rate is controlled 3. Nontraumatic subarachnoid hemorrhage at baseline 4. COPD stable 5. Dysphagia at baseline right now   I have personally seen and evaluated the patient, evaluated laboratory and imaging results, formulated the assessment and plan and placed orders. The Patient requires high complexity decision making for assessment and support.  Case was discussed on Rounds with the Respiratory Therapy Staff  Yevonne Pax, MD [facility] Pulmonary Critical Care Medicine Sleep Medicine

## 2018-02-19 DIAGNOSIS — I482 Chronic atrial fibrillation, unspecified: Secondary | ICD-10-CM | POA: Diagnosis not present

## 2018-02-19 DIAGNOSIS — I69391 Dysphagia following cerebral infarction: Secondary | ICD-10-CM | POA: Diagnosis not present

## 2018-02-19 DIAGNOSIS — I609 Nontraumatic subarachnoid hemorrhage, unspecified: Secondary | ICD-10-CM | POA: Diagnosis not present

## 2018-02-19 DIAGNOSIS — J9621 Acute and chronic respiratory failure with hypoxia: Secondary | ICD-10-CM | POA: Diagnosis not present

## 2018-02-19 NOTE — Progress Notes (Signed)
Pulmonary Critical Care Medicine [facility] [place]   PULMONARY CRITICAL CARE SERVICE  PROGRESS NOTE  Date of Service: 02/19/2018  HAZLEIGH MCCLEAVE  ZOX:[id number]  DOB: 1960-08-31   DOA: 02/09/2018  Referring Physician: Carron Curie, MD  HPI: Evelyn Wilson is a 57 y.o. female seen for follow up of Acute on Chronic Respiratory Failure.  Patient is on T collar currently on 28% FiO2 has been off the ventilator more than 24 hours  Medications: Reviewed on Rounds  Physical Exam:  Vitals: Temperature 97.8 pulse 61 respiratory rate 17 blood pressure 108/54 saturations 99%  Ventilator Settings off the ventilator on T collar currently  . General: Comfortable at this time . Eyes: Grossly normal lids, irises & conjunctiva . ENT: grossly tongue is normal . Neck: no obvious mass . Cardiovascular: S1 S2 normal no gallop . Respiratory: No rhonchi or rales are noted . Abdomen: soft . Skin: no rash seen on limited exam . Musculoskeletal: not rigid . Psychiatric:unable to assess . Neurologic: no seizure no involuntary movements         Lab Data:   Basic Metabolic Panel: Recent Labs  Lab 02/18/18 0629  NA 138  K 4.5  CL 101  CO2 29  GLUCOSE 145*  BUN 16  CREATININE 0.61  CALCIUM 8.8*    ABG: No results for input(s): PHART, PCO2ART, PO2ART, HCO3, O2SAT in the last 168 hours.  Liver Function Tests: No results for input(s): AST, ALT, ALKPHOS, BILITOT, PROT, ALBUMIN in the last 168 hours. No results for input(s): LIPASE, AMYLASE in the last 168 hours. No results for input(s): AMMONIA in the last 168 hours.  CBC: Recent Labs  Lab 02/18/18 0629  WBC 10.7*  HGB 10.6*  HCT 35.9*  MCV 93.0  PLT 347    Cardiac Enzymes: No results for input(s): CKTOTAL, CKMB, CKMBINDEX, TROPONINI in the last 168 hours.  BNP (last 3 results) No results for input(s): BNP in the last 8760 hours.  ProBNP (last 3 results) No results for input(s): PROBNP in the last 8760  hours.  Radiological Exams: No results found.  Assessment/Plan Active Problems:   Acute on chronic respiratory failure with hypoxia (HCC)   Nontraumatic subarachnoid hemorrhage (HCC)   Chronic atrial fibrillation   Obstructive chronic bronchitis without exacerbation (HCC)   Dysphagia as late effect of cerebrovascular accident (CVA)   1. Acute on chronic respiratory failure with hypoxia we will continue with T collar trials continue pulmonary toilet secretion management. 2. Nontraumatic subarachnoid hemorrhage at baseline we will continue with present management 3. Chronic atrial fibrillation rate is controlled 4. COPD severe disease we will continue present therapy 5. Dysphagia not able to swallow follow-up with speech   I have personally seen and evaluated the patient, evaluated laboratory and imaging results, formulated the assessment and plan and placed orders. The Patient requires high complexity decision making for assessment and support.  Case was discussed on Rounds with the Respiratory Therapy Staff  Yevonne Pax, MD [facility] Pulmonary Critical Care Medicine Sleep Medicine

## 2018-02-20 DIAGNOSIS — I609 Nontraumatic subarachnoid hemorrhage, unspecified: Secondary | ICD-10-CM | POA: Diagnosis not present

## 2018-02-20 DIAGNOSIS — I69391 Dysphagia following cerebral infarction: Secondary | ICD-10-CM | POA: Diagnosis not present

## 2018-02-20 DIAGNOSIS — J9621 Acute and chronic respiratory failure with hypoxia: Secondary | ICD-10-CM | POA: Diagnosis not present

## 2018-02-20 DIAGNOSIS — I482 Chronic atrial fibrillation, unspecified: Secondary | ICD-10-CM | POA: Diagnosis not present

## 2018-02-20 NOTE — Progress Notes (Signed)
Pulmonary Critical Care Medicine [facility] [place]   PULMONARY CRITICAL CARE SERVICE  PROGRESS NOTE  Date of Service: 02/20/2018  Evelyn Wilson  ZOX:[id number]  DOB: 1960-12-16   DOA: 02/09/2018  Referring Physician: Carron Curie, MD  HPI: Evelyn Wilson is a 57 y.o. female seen for follow up of Acute on Chronic Respiratory Failure.  Currently is on T collar has been on 28% oxygen saturations are excellent patient's ventilator ventilator for more than 48 hours  Medications: Reviewed on Rounds  Physical Exam:  Vitals: Temperature 97.9 pulse 70 respiratory 15 blood pressure 120/58 saturation 98%  Ventilator Settings currently is on T collar FiO2 28%  . General: Comfortable at this time . Eyes: Grossly normal lids, irises & conjunctiva . ENT: grossly tongue is normal . Neck: no obvious mass . Cardiovascular: S1 S2 normal no gallop . Respiratory: No rhonchi or rales . Abdomen: soft . Skin: no rash seen on limited exam . Musculoskeletal: not rigid . Psychiatric:unable to assess . Neurologic: no seizure no involuntary movements         Lab Data:   Basic Metabolic Panel: Recent Labs  Lab 02/18/18 0629  NA 138  K 4.5  CL 101  CO2 29  GLUCOSE 145*  BUN 16  CREATININE 0.61  CALCIUM 8.8*    ABG: No results for input(s): PHART, PCO2ART, PO2ART, HCO3, O2SAT in the last 168 hours.  Liver Function Tests: No results for input(s): AST, ALT, ALKPHOS, BILITOT, PROT, ALBUMIN in the last 168 hours. No results for input(s): LIPASE, AMYLASE in the last 168 hours. No results for input(s): AMMONIA in the last 168 hours.  CBC: Recent Labs  Lab 02/18/18 0629  WBC 10.7*  HGB 10.6*  HCT 35.9*  MCV 93.0  PLT 347    Cardiac Enzymes: No results for input(s): CKTOTAL, CKMB, CKMBINDEX, TROPONINI in the last 168 hours.  BNP (last 3 results) No results for input(s): BNP in the last 8760 hours.  ProBNP (last 3 results) No results for input(s): PROBNP in the last 8760  hours.  Radiological Exams: No results found.  Assessment/Plan Active Problems:   Acute on chronic respiratory failure with hypoxia (HCC)   Nontraumatic subarachnoid hemorrhage (HCC)   Chronic atrial fibrillation   Obstructive chronic bronchitis without exacerbation (HCC)   Dysphagia as late effect of cerebrovascular accident (CVA)   1. Acute on chronic respiratory failure with hypoxia continue with T collar trials off the ventilator for more than 48 hours continue pulmonary toilet secretion management 2. Nontraumatic subarachnoid hemorrhage unchanged we will continue with present management 3. Chronic atrial fibrillation rate is controlled 4. COPD stable at this time 5. Dysphagia follow-up with speech   I have personally seen and evaluated the patient, evaluated laboratory and imaging results, formulated the assessment and plan and placed orders. The Patient requires high complexity decision making for assessment and support.  Case was discussed on Rounds with the Respiratory Therapy Staff  Yevonne Pax, MD [facility] Pulmonary Critical Care Medicine Sleep Medicine

## 2018-02-21 DIAGNOSIS — I609 Nontraumatic subarachnoid hemorrhage, unspecified: Secondary | ICD-10-CM | POA: Diagnosis not present

## 2018-02-21 DIAGNOSIS — I69391 Dysphagia following cerebral infarction: Secondary | ICD-10-CM | POA: Diagnosis not present

## 2018-02-21 DIAGNOSIS — J9621 Acute and chronic respiratory failure with hypoxia: Secondary | ICD-10-CM | POA: Diagnosis not present

## 2018-02-21 DIAGNOSIS — I482 Chronic atrial fibrillation, unspecified: Secondary | ICD-10-CM | POA: Diagnosis not present

## 2018-02-21 NOTE — Progress Notes (Signed)
Pulmonary Critical Care Medicine [facility] [place]   PULMONARY CRITICAL CARE SERVICE  PROGRESS NOTE  Date of Service: 02/21/2018  Evelyn FreezeLinda J Wilson  WJX:[id number]  DOB: 03/28/1961   DOA: 02/09/2018  Referring Physician: Carron CurieAli Hijazi, MD  HPI: Evelyn Wilson is a [age] y.o. female seen for follow up of Acute on Chronic Respiratory Failure.  Comfortable without distress patient is on T collar has been off the ventilator for more than 48 hours  Medications: Reviewed on Rounds  Physical Exam:  Vitals: Temperature 97.3 pulse 63 respiratory rate 12 blood pressure 111/52 saturations 99%  Ventilator Settings off the ventilator on T collar  . General: Comfortable at this time . Eyes: Grossly normal lids, irises & conjunctiva . ENT: grossly tongue is normal . Neck: no obvious mass . Cardiovascular: S1 S2 normal no gallop . Respiratory: Coarse breath sounds no rhonchi or rales . Abdomen: soft . Skin: no rash seen on limited exam . Musculoskeletal: not rigid . Psychiatric:unable to assess . Neurologic: no seizure no involuntary movements         Lab Data:   Basic Metabolic Panel: Recent Labs  Lab 02/18/18 0629  NA 138  K 4.5  CL 101  CO2 29  GLUCOSE 145*  BUN 16  CREATININE 0.61  CALCIUM 8.8*    ABG: No results for input(s): PHART, PCO2ART, PO2ART, HCO3, O2SAT in the last 168 hours.  Liver Function Tests: No results for input(s): AST, ALT, ALKPHOS, BILITOT, PROT, ALBUMIN in the last 168 hours. No results for input(s): LIPASE, AMYLASE in the last 168 hours. No results for input(s): AMMONIA in the last 168 hours.  CBC: Recent Labs  Lab 02/18/18 0629  WBC 10.7*  HGB 10.6*  HCT 35.9*  MCV 93.0  PLT 347    Cardiac Enzymes: No results for input(s): CKTOTAL, CKMB, CKMBINDEX, TROPONINI in the last 168 hours.  BNP (last 3 results) No results for input(s): BNP in the last 8760 hours.  ProBNP (last 3 results) No results for input(s): PROBNP in the last 8760  hours.  Radiological Exams: No results found.  Assessment/Plan Active Problems:   Acute on chronic respiratory failure with hypoxia (HCC)   Nontraumatic subarachnoid hemorrhage (HCC)   Chronic atrial fibrillation   Obstructive chronic bronchitis without exacerbation (HCC)   Dysphagia as late effect of cerebrovascular accident (CVA)   1. Acute on chronic respiratory failure with hypoxia we will continue with T collar trials continue pulmonary toilet secretion management. 2. Nontraumatic subarachnoid hemorrhage at baseline we will continue present management 3. Chronic atrial fibrillation rate is controlled 4. COPD advanced disease we will continue with present management 5. Dysphagia stable at this time will monitor   I have personally seen and evaluated the patient, evaluated laboratory and imaging results, formulated the assessment and plan and placed orders. The Patient requires high complexity decision making for assessment and support.  Case was discussed on Rounds with the Respiratory Therapy Staff  Yevonne PaxSaadat A Aidan Moten, MD [facility] Pulmonary Critical Care Medicine Sleep Medicine

## 2018-02-22 DIAGNOSIS — I69391 Dysphagia following cerebral infarction: Secondary | ICD-10-CM | POA: Diagnosis not present

## 2018-02-22 DIAGNOSIS — I609 Nontraumatic subarachnoid hemorrhage, unspecified: Secondary | ICD-10-CM | POA: Diagnosis not present

## 2018-02-22 DIAGNOSIS — J9621 Acute and chronic respiratory failure with hypoxia: Secondary | ICD-10-CM | POA: Diagnosis not present

## 2018-02-22 DIAGNOSIS — I482 Chronic atrial fibrillation, unspecified: Secondary | ICD-10-CM | POA: Diagnosis not present

## 2018-02-22 NOTE — Progress Notes (Signed)
[facility] [place]   PULMONARY CRITICAL CARE SERVICE  PROGRESS NOTE  Date of Service: 02/22/2018  Evelyn FreezeLinda J Wilson  WUJ:[id number]  DOB: 08/18/1960   DOA: 02/09/2018  Referring Physician: Carron CurieAli Hijazi, MD  HPI: Evelyn FreezeLinda J Wilson is a 57 y.o. female seen for follow up of Acute on Chronic Respiratory Failure.  Currently is on T collar patient is on 28% FiO2 tolerating PMV we should be able to advance it further  Medications: Reviewed on Rounds  Physical Exam:  Vitals: Temperature 98.8 pulse 65 respiratory rate 15 blood pressure 135/65 saturations 97%  Ventilator Settings off the ventilator on T collar right now  . General: Comfortable at this time . Eyes: Grossly normal lids, irises & conjunctiva . ENT: grossly tongue is normal . Neck: no obvious mass . Cardiovascular: S1 S2 normal no gallop . Respiratory: No rhonchi no rales are noted . Abdomen: soft . Skin: no rash seen on limited exam . Musculoskeletal: not rigid . Psychiatric:unable to assess . Neurologic: no seizure no involuntary movements         Lab Data:   Basic Metabolic Panel: Recent Labs  Lab 02/18/18 0629  NA 138  K 4.5  CL 101  CO2 29  GLUCOSE 145*  BUN 16  CREATININE 0.61  CALCIUM 8.8*    ABG: No results for input(s): PHART, PCO2ART, PO2ART, HCO3, O2SAT in the last 168 hours.  Liver Function Tests: No results for input(s): AST, ALT, ALKPHOS, BILITOT, PROT, ALBUMIN in the last 168 hours. No results for input(s): LIPASE, AMYLASE in the last 168 hours. No results for input(s): AMMONIA in the last 168 hours.  CBC: Recent Labs  Lab 02/18/18 0629  WBC 10.7*  HGB 10.6*  HCT 35.9*  MCV 93.0  PLT 347    Cardiac Enzymes: No results for input(s): CKTOTAL, CKMB, CKMBINDEX, TROPONINI in the last 168 hours.  BNP (last 3 results) No results for input(s): BNP in the last 8760 hours.  ProBNP (last 3 results) No results for input(s): PROBNP in the last 8760  hours.  Radiological Exams: No results found.  Assessment/Plan Active Problems:   Acute on chronic respiratory failure with hypoxia (HCC)   Nontraumatic subarachnoid hemorrhage (HCC)   Chronic atrial fibrillation   Obstructive chronic bronchitis without exacerbation (HCC)   Dysphagia as late effect of cerebrovascular accident (CVA)   1. Acute on chronic respiratory failure with hypoxia continue with T collar trials continue pulmonary toilet secretion management.  Patient is actually doing well with the PMV 2. Non-traumatic subarachnoid hemorrhage at baseline continue present management 3. Chronic atrial fibrillation rate is controlled we will follow along 4. Obstructive chronic bronchitis COPD we will continue present management 5. Dysphagia unchanged we will continue to monitor   I have personally seen and evaluated the patient, evaluated laboratory and imaging results, formulated the assessment and plan and placed orders. The Patient requires high complexity decision making for assessment and support.  Case was discussed on Rounds with the Respiratory Therapy Staff  Yevonne PaxSaadat A Khan, MD [facility] Pulmonary Critical Care Medicine Sleep Medicine

## 2018-02-23 DIAGNOSIS — I482 Chronic atrial fibrillation, unspecified: Secondary | ICD-10-CM | POA: Diagnosis not present

## 2018-02-23 DIAGNOSIS — J9621 Acute and chronic respiratory failure with hypoxia: Secondary | ICD-10-CM | POA: Diagnosis not present

## 2018-02-23 DIAGNOSIS — I69391 Dysphagia following cerebral infarction: Secondary | ICD-10-CM | POA: Diagnosis not present

## 2018-02-23 DIAGNOSIS — I609 Nontraumatic subarachnoid hemorrhage, unspecified: Secondary | ICD-10-CM | POA: Diagnosis not present

## 2018-02-23 NOTE — Progress Notes (Signed)
[facility] [place]   PULMONARY CRITICAL CARE SERVICE  PROGRESS NOTE  Date of Service: 02/23/2018  Evelyn FreezeLinda J Wilson  RUE:[id number]:[id number]  DOB: 06/20/1960   DOA: 02/09/2018  Referring Physician: Carron CurieAli Hijazi, MD  HPI: Evelyn Wilson is a 57 y.o. female seen for follow up of Acute on Chronic Respiratory Failure.  Currently is on T collar with the PMV doing fairly well.  We should be able to advance to capping  Medications: Reviewed on Rounds  Physical Exam:  Vitals: Temperature 97.8 pulse 85 respiratory rate 14 blood pressure 142/67 saturations 100%  Ventilator Settings off the ventilator on T collar currently on 28% FiO2  . General: Comfortable at this time . Eyes: Grossly normal lids, irises & conjunctiva . ENT: grossly tongue is normal . Neck: no obvious mass . Cardiovascular: S1 S2 normal no gallop . Respiratory: No rhonchi no rales are noted at this time . Abdomen: soft . Skin: no rash seen on limited exam . Musculoskeletal: not rigid . Psychiatric:unable to assess . Neurologic: no seizure no involuntary movements         Lab Data:   Basic Metabolic Panel: Recent Labs  Lab 02/18/18 0629  NA 138  K 4.5  CL 101  CO2 29  GLUCOSE 145*  BUN 16  CREATININE 0.61  CALCIUM 8.8*    ABG: No results for input(s): PHART, PCO2ART, PO2ART, HCO3, O2SAT in the last 168 hours.  Liver Function Tests: No results for input(s): AST, ALT, ALKPHOS, BILITOT, PROT, ALBUMIN in the last 168 hours. No results for input(s): LIPASE, AMYLASE in the last 168 hours. No results for input(s): AMMONIA in the last 168 hours.  CBC: Recent Labs  Lab 02/18/18 0629  WBC 10.7*  HGB 10.6*  HCT 35.9*  MCV 93.0  PLT 347    Cardiac Enzymes: No results for input(s): CKTOTAL, CKMB, CKMBINDEX, TROPONINI in the last 168 hours.  BNP (last 3 results) No results for input(s): BNP in the last 8760 hours.  ProBNP (last 3 results) No results for input(s):  PROBNP in the last 8760 hours.  Radiological Exams: No results found.  Assessment/Plan Active Problems:   Acute on chronic respiratory failure with hypoxia (HCC)   Nontraumatic subarachnoid hemorrhage (HCC)   Chronic atrial fibrillation   Obstructive chronic bronchitis without exacerbation (HCC)   Dysphagia as late effect of cerebrovascular accident (CVA)   1. Acute on chronic respiratory failure with hypoxia continue with capping trials continue secretion management pulmonary toilet. 2. Nontraumatic subarachnoid hemorrhage grossly unchanged stable at this time 3. Chronic atrial fibrillation rate is controlled 4. COPD severe disease continue to monitor. 5. Dysphagia unchanged   I have personally seen and evaluated the patient, evaluated laboratory and imaging results, formulated the assessment and plan and placed orders. The Patient requires high complexity decision making for assessment and support.  Case was discussed on Rounds with the Respiratory Therapy Staff  Yevonne PaxSaadat A Khan, MD [facility] Pulmonary Critical Care Medicine Sleep Medicine

## 2018-02-24 DIAGNOSIS — I69391 Dysphagia following cerebral infarction: Secondary | ICD-10-CM | POA: Diagnosis not present

## 2018-02-24 DIAGNOSIS — I482 Chronic atrial fibrillation, unspecified: Secondary | ICD-10-CM | POA: Diagnosis not present

## 2018-02-24 DIAGNOSIS — J9621 Acute and chronic respiratory failure with hypoxia: Secondary | ICD-10-CM | POA: Diagnosis not present

## 2018-02-24 DIAGNOSIS — I609 Nontraumatic subarachnoid hemorrhage, unspecified: Secondary | ICD-10-CM | POA: Diagnosis not present

## 2018-02-24 LAB — CBC
HEMATOCRIT: 38.3 % (ref 36.0–46.0)
HEMOGLOBIN: 11.2 g/dL — AB (ref 12.0–15.0)
MCH: 27.5 pg (ref 26.0–34.0)
MCHC: 29.2 g/dL — ABNORMAL LOW (ref 30.0–36.0)
MCV: 93.9 fL (ref 80.0–100.0)
NRBC: 0 % (ref 0.0–0.2)
PLATELETS: 262 10*3/uL (ref 150–400)
RBC: 4.08 MIL/uL (ref 3.87–5.11)
RDW: 15 % (ref 11.5–15.5)
WBC: 10.6 10*3/uL — ABNORMAL HIGH (ref 4.0–10.5)

## 2018-02-24 LAB — BASIC METABOLIC PANEL
ANION GAP: 9 (ref 5–15)
BUN: 14 mg/dL (ref 6–20)
CHLORIDE: 98 mmol/L (ref 98–111)
CO2: 31 mmol/L (ref 22–32)
Calcium: 8.8 mg/dL — ABNORMAL LOW (ref 8.9–10.3)
Creatinine, Ser: 0.54 mg/dL (ref 0.44–1.00)
GFR calc Af Amer: >60 mL/min (ref >60)
GLUCOSE: 156 mg/dL — AB (ref 70–99)
POTASSIUM: 4.4 mmol/L (ref 3.5–5.1)
Sodium: 138 mmol/L (ref 135–145)

## 2018-02-24 NOTE — Progress Notes (Signed)
[facility]   PULMONARY CRITICAL CARE SERVICE  PROGRESS NOTE  Date of Service: 02/24/2018  Evelyn FreezeLinda J Wilson  WUJ:[id number]  DOB: 11/23/1960   DOA: 02/09/2018  Referring Physician: Carron CurieAli Hijazi, MD  HPI: Evelyn Wilson is a 57 y.o. female seen for follow up of Acute on Chronic Respiratory Failure.  Patient is on T collar right now has been failing trials on PMV  Medications: Reviewed on Rounds  Physical Exam:  Vitals: Temperature 98.5 pulse 59 respiratory 28 blood pressure 144/58 saturations 98%  Ventilator Settings currently is on T collar FiO2 28%  . General: Comfortable at this time . Eyes: Grossly normal lids, irises & conjunctiva . ENT: grossly tongue is normal . Neck: no obvious mass . Cardiovascular: S1 S2 normal no gallop . Respiratory: No rhonchi or rales are noted at this time . Abdomen: soft . Skin: no rash seen on limited exam . Musculoskeletal: not rigid . Psychiatric:unable to assess . Neurologic: no seizure no involuntary movements         Lab Data:   Basic Metabolic Panel: Recent Labs  Lab 02/18/18 0629 02/24/18 0650  NA 138 138  K 4.5 4.4  CL 101 98  CO2 29 31  GLUCOSE 145* 156*  BUN 16 14  CREATININE 0.61 0.54  CALCIUM 8.8* 8.8*    ABG: No results for input(s): PHART, PCO2ART, PO2ART, HCO3, O2SAT in the last 168 hours.  Liver Function Tests: No results for input(s): AST, ALT, ALKPHOS, BILITOT, PROT, ALBUMIN in the last 168 hours. No results for input(s): LIPASE, AMYLASE in the last 168 hours. No results for input(s): AMMONIA in the last 168 hours.  CBC: Recent Labs  Lab 02/18/18 0629 02/24/18 0650  WBC 10.7* 10.6*  HGB 10.6* 11.2*  HCT 35.9* 38.3  MCV 93.0 93.9  PLT 347 262    Cardiac Enzymes: No results for input(s): CKTOTAL, CKMB, CKMBINDEX, TROPONINI in the last 168 hours.  BNP (last 3 results) No results for input(s): BNP in the last 8760 hours.  ProBNP (last 3  results) No results for input(s): PROBNP in the last 8760 hours.  Radiological Exams: No results found.  Assessment/Plan Active Problems:   Acute on chronic respiratory failure with hypoxia (HCC)   Nontraumatic subarachnoid hemorrhage (HCC)   Chronic atrial fibrillation   Obstructive chronic bronchitis without exacerbation (HCC)   Dysphagia as late effect of cerebrovascular accident (CVA)   1. Acute on chronic respiratory failure with hypoxia we will continue with T collar reassess for PMV tomorrow again 2. Chronic atrial fibrillation rate is controlled 3. Nontraumatic subarachnoid hemorrhage at baseline 4. 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 Dysphagia at baseline   I have personally seen and evaluated the patient, evaluated laboratory and imaging results, formulated the assessment and plan and placed orders. The Patient requires high complexity decision making for assessment and support.  Case was discussed on Rounds with the Respiratory Therapy Staff  Yevonne PaxSaadat A Kamarius Buckbee, MD [facility] Pulmonary Critical Care Medicine Sleep Medicine

## 2018-02-25 DIAGNOSIS — J9621 Acute and chronic respiratory failure with hypoxia: Secondary | ICD-10-CM | POA: Diagnosis not present

## 2018-02-25 DIAGNOSIS — I609 Nontraumatic subarachnoid hemorrhage, unspecified: Secondary | ICD-10-CM | POA: Diagnosis not present

## 2018-02-25 DIAGNOSIS — I482 Chronic atrial fibrillation, unspecified: Secondary | ICD-10-CM | POA: Diagnosis not present

## 2018-02-25 DIAGNOSIS — I69391 Dysphagia following cerebral infarction: Secondary | ICD-10-CM | POA: Diagnosis not present

## 2018-02-25 NOTE — Progress Notes (Signed)
[facility]   PULMONARY CRITICAL CARE SERVICE  PROGRESS NOTE  Date of Service: 02/25/2018  Evelyn Wilson  BMW:[id number]  DOB: 05/10/1960   DOA: 02/09/2018  Referring Physician: Carron CurieAli Hijazi, MD  HPI: Evelyn Wilson is a 57 y.o. female seen for follow up of Acute on Chronic Respiratory Failure.  Patient is on T collar at this time secretions are fair to moderate at baseline  Medications: Reviewed on Rounds  Physical Exam:  Vitals: Temperature 98.0 pulse 60 respiratory rate 18 blood pressure 111/50 saturations 98%  Ventilator Settings off the ventilator on T collar right  . General: Comfortable at this time . Eyes: Grossly normal lids, irises & conjunctiva . ENT: grossly tongue is normal . Neck: no obvious mass . Cardiovascular: S1 S2 normal no gallop . Respiratory: No rhonchi no rales . Abdomen: soft . Skin: no rash seen on limited exam . Musculoskeletal: not rigid . Psychiatric:unable to assess . Neurologic: no seizure no involuntary movements         Lab Data:   Basic Metabolic Panel: Recent Labs  Lab 02/24/18 0650  NA 138  K 4.4  CL 98  CO2 31  GLUCOSE 156*  BUN 14  CREATININE 0.54  CALCIUM 8.8*    ABG: No results for input(s): PHART, PCO2ART, PO2ART, HCO3, O2SAT in the last 168 hours.  Liver Function Tests: No results for input(s): AST, ALT, ALKPHOS, BILITOT, PROT, ALBUMIN in the last 168 hours. No results for input(s): LIPASE, AMYLASE in the last 168 hours. No results for input(s): AMMONIA in the last 168 hours.  CBC: Recent Labs  Lab 02/24/18 0650  WBC 10.6*  HGB 11.2*  HCT 38.3  MCV 93.9  PLT 262    Cardiac Enzymes: No results for input(s): CKTOTAL, CKMB, CKMBINDEX, TROPONINI in the last 168 hours.  BNP (last 3 results) No results for input(s): BNP in the last 8760 hours.  ProBNP (last 3 results) No results for input(s): PROBNP in the last 8760 hours.  Radiological Exams: No results  found.  Assessment/Plan Active Problems:   Acute on chronic respiratory failure with hypoxia (HCC)   Nontraumatic subarachnoid hemorrhage (HCC)   Chronic atrial fibrillation   Obstructive chronic bronchitis without exacerbation (HCC)   Dysphagia as late effect of cerebrovascular accident (CVA)   1. Acute on chronic respiratory failure with hypoxia we will continue weaning on T collar continue aggressive pulmonary toilet supportive care. 2. Nontraumatic subarachnoid hemorrhage at baseline we will continue present therapy 3. Chronic atrial fibrillation rate is controlled 4. COPD severe disease we will monitor 5. Dysphagia at baseline   I have personally seen and evaluated the patient, evaluated laboratory and imaging results, formulated the assessment and plan and placed orders. The Patient requires high complexity decision making for assessment and support.  Case was discussed on Rounds with the Respiratory Therapy Staff  Yevonne PaxSaadat A Khan, MD [facility] Pulmonary Critical Care Medicine Sleep Medicine

## 2018-02-26 DIAGNOSIS — I609 Nontraumatic subarachnoid hemorrhage, unspecified: Secondary | ICD-10-CM | POA: Diagnosis not present

## 2018-02-26 DIAGNOSIS — I482 Chronic atrial fibrillation, unspecified: Secondary | ICD-10-CM | POA: Diagnosis not present

## 2018-02-26 DIAGNOSIS — I69391 Dysphagia following cerebral infarction: Secondary | ICD-10-CM | POA: Diagnosis not present

## 2018-02-26 DIAGNOSIS — J9621 Acute and chronic respiratory failure with hypoxia: Secondary | ICD-10-CM | POA: Diagnosis not present

## 2018-02-26 NOTE — Progress Notes (Signed)
[facility] Critical [facility]   PULMONARY CRITICAL CARE SERVICE  PROGRESS NOTE  Date of Service: 02/26/2018  Evelyn Wilson  NFA:[id number]  DOB: 01/28/1961   DOA: 02/09/2018  Referring Physician: Carron CurieAli Hijazi, MD  HPI: Evelyn Wilson is a 57 y.o. female seen for follow up of Acute on Chronic Respiratory Failure.  Currently on T collar has been on 28% FiO2  Medications: Reviewed on Rounds  Physical Exam:  Vitals: Temperature 98.2 pulse 85 respiratory rate 32 blood pressure 109/53 saturations 93%  Ventilator Settings off the ventilator on T collar right  . General: Comfortable at this time . Eyes: Grossly normal lids, irises & conjunctiva . ENT: grossly tongue is normal . Neck: no obvious mass . Cardiovascular: S1 S2 normal no gallop . Respiratory: No rhonchi or rales are noted . Abdomen: soft . Skin: no rash seen on limited exam . Musculoskeletal: not rigid . Psychiatric:unable to assess . Neurologic: no seizure no involuntary movements         Lab Data:   Basic Metabolic Panel: Recent Labs  Lab 02/24/18 0650  NA 138  K 4.4  CL 98  CO2 31  GLUCOSE 156*  BUN 14  CREATININE 0.54  CALCIUM 8.8*    ABG: No results for input(s): PHART, PCO2ART, PO2ART, HCO3, O2SAT in the last 168 hours.  Liver Function Tests: No results for input(s): AST, ALT, ALKPHOS, BILITOT, PROT, ALBUMIN in the last 168 hours. No results for input(s): LIPASE, AMYLASE in the last 168 hours. No results for input(s): AMMONIA in the last 168 hours.  CBC: Recent Labs  Lab 02/24/18 0650  WBC 10.6*  HGB 11.2*  HCT 38.3  MCV 93.9  PLT 262    Cardiac Enzymes: No results for input(s): CKTOTAL, CKMB, CKMBINDEX, TROPONINI in the last 168 hours.  BNP (last 3 results) No results for input(s): BNP in the last 8760 hours.  ProBNP (last 3 results) No results for input(s): PROBNP in the last 8760 hours.  Radiological Exams: No results  found.  Assessment/Plan Active Problems:   Acute on chronic respiratory failure with hypoxia (HCC)   Nontraumatic subarachnoid hemorrhage (HCC)   Chronic atrial fibrillation   Obstructive chronic bronchitis without exacerbation (HCC)   Dysphagia as late effect of cerebrovascular accident (CVA)   1. Acute on chronic respiratory failure with hypoxia we will continue with T collar titrate oxygen continue pulmonary toilet secretion management. 2. Nontraumatic subarachnoid hemorrhage at baseline continue to follow 3. Chronic atrial fibrillation rate is controlled 4. COPD at baseline continue to monitor 5. Dysphagia is stable unchanged   I have personally seen and evaluated the patient, evaluated laboratory and imaging results, formulated the assessment and plan and placed orders. The Patient requires high complexity decision making for assessment and support.  Case was discussed on Rounds with the Respiratory Therapy Staff  Yevonne PaxSaadat A Treshon Stannard, MD [facility] Pulmonary Critical Care Medicine Sleep Medicine

## 2018-02-27 DIAGNOSIS — I69391 Dysphagia following cerebral infarction: Secondary | ICD-10-CM | POA: Diagnosis not present

## 2018-02-27 DIAGNOSIS — I482 Chronic atrial fibrillation, unspecified: Secondary | ICD-10-CM | POA: Diagnosis not present

## 2018-02-27 DIAGNOSIS — I609 Nontraumatic subarachnoid hemorrhage, unspecified: Secondary | ICD-10-CM | POA: Diagnosis not present

## 2018-02-27 DIAGNOSIS — J9621 Acute and chronic respiratory failure with hypoxia: Secondary | ICD-10-CM | POA: Diagnosis not present

## 2018-02-27 NOTE — Progress Notes (Signed)
[facility]   PULMONARY CRITICAL CARE SERVICE  PROGRESS NOTE  Date of Service: 02/27/2018  Evelyn FreezeLinda J Wilson  ZOX:[id number]  DOB: 11/08/1960   DOA: 02/09/2018  Referring Physician: Carron CurieAli Hijazi, MD  HPI: Evelyn Wilson is a 57 y.o. female seen for follow up of Acute on Chronic Respiratory Failure.  Patient is comfortable without distress.  Right now is on 28% FiO2  Medications: Reviewed on Rounds  Physical Exam:  Vitals: Temperature 96.7 pulse 66 respiratory 23 blood pressure 121/52 saturation 99%  Ventilator Settings off the ventilator on T collar  . General: Comfortable at this time . Eyes: Grossly normal lids, irises & conjunctiva . ENT: grossly tongue is normal . Neck: no obvious mass . Cardiovascular: S1 S2 normal no gallop . Respiratory: No rhonchi or rales . Abdomen: soft . Skin: no rash seen on limited exam . Musculoskeletal: not rigid . Psychiatric:unable to assess . Neurologic: no seizure no involuntary movements         Lab Data:   Basic Metabolic Panel: Recent Labs  Lab 02/24/18 0650  NA 138  K 4.4  CL 98  CO2 31  GLUCOSE 156*  BUN 14  CREATININE 0.54  CALCIUM 8.8*    ABG: No results for input(s): PHART, PCO2ART, PO2ART, HCO3, O2SAT in the last 168 hours.  Liver Function Tests: No results for input(s): AST, ALT, ALKPHOS, BILITOT, PROT, ALBUMIN in the last 168 hours. No results for input(s): LIPASE, AMYLASE in the last 168 hours. No results for input(s): AMMONIA in the last 168 hours.  CBC: Recent Labs  Lab 02/24/18 0650  WBC 10.6*  HGB 11.2*  HCT 38.3  MCV 93.9  PLT 262    Cardiac Enzymes: No results for input(s): CKTOTAL, CKMB, CKMBINDEX, TROPONINI in the last 168 hours.  BNP (last 3 results) No results for input(s): BNP in the last 8760 hours.  ProBNP (last 3 results) No results for input(s): PROBNP in the last 8760 hours.  Radiological Exams: No results  found.  Assessment/Plan Active Problems:   Acute on chronic respiratory failure with hypoxia (HCC)   Nontraumatic subarachnoid hemorrhage (HCC)   Chronic atrial fibrillation   Obstructive chronic bronchitis without exacerbation (HCC)   Dysphagia as late effect of cerebrovascular accident (CVA)   1. Acute on chronic respiratory failure with hypoxia continue weaning on T collar titrate oxygen continue pulmonary toilet 2. Chronic atrial fibrillation rate is controlled 3. Nontraumatic subarachnoid hemorrhage at baseline 4. COPD severe disease we will continue with supportive care   I have personally seen and evaluated the patient, evaluated laboratory and imaging results, formulated the assessment and plan and placed orders. The Patient requires high complexity decision making for assessment and support.  Case was discussed on Rounds with the Respiratory Therapy Staff  Yevonne PaxSaadat A Estuardo Frisbee, MD [facility] Pulmonary Critical Care Medicine Sleep Medicine

## 2018-02-28 DIAGNOSIS — I482 Chronic atrial fibrillation, unspecified: Secondary | ICD-10-CM | POA: Diagnosis not present

## 2018-02-28 DIAGNOSIS — I69391 Dysphagia following cerebral infarction: Secondary | ICD-10-CM | POA: Diagnosis not present

## 2018-02-28 DIAGNOSIS — J9621 Acute and chronic respiratory failure with hypoxia: Secondary | ICD-10-CM | POA: Diagnosis not present

## 2018-02-28 DIAGNOSIS — I609 Nontraumatic subarachnoid hemorrhage, unspecified: Secondary | ICD-10-CM | POA: Diagnosis not present

## 2018-02-28 NOTE — Progress Notes (Signed)
Pulmonary Critical Care Medicine [facility]   PULMONARY CRITICAL CARE SERVICE  PROGRESS NOTE  Date of Service: 02/28/2018  Evelyn Wilson  NWG:[id number]  DOB: 09/16/1960   DOA: 02/09/2018  Referring Physician: Carron CurieAli Hijazi, MD  HPI: Evelyn Wilson is a 57 y.o. female seen for follow up of Acute on Chronic Respiratory Failure.  Patient is on T collar currently on 28% FiO2 comfortable right now  Medications: Reviewed on Rounds  Physical Exam:  Vitals: Temperature 97.9 pulse 65 respiratory 16 blood pressure 138/71 saturation 99%  Ventilator Settings off the ventilator on T collar  . General: Comfortable at this time . Eyes: Grossly normal lids, irises & conjunctiva . ENT: grossly tongue is normal . Neck: no obvious mass . Cardiovascular: S1 S2 normal no gallop . Respiratory: No rhonchi no rales . Abdomen: soft . Skin: no rash seen on limited exam . Musculoskeletal: not rigid . Psychiatric:unable to assess . Neurologic: no seizure no involuntary movements         Lab Data:   Basic Metabolic Panel: Recent Labs  Lab 02/24/18 0650  NA 138  K 4.4  CL 98  CO2 31  GLUCOSE 156*  BUN 14  CREATININE 0.54  CALCIUM 8.8*    ABG: No results for input(s): PHART, PCO2ART, PO2ART, HCO3, O2SAT in the last 168 hours.  Liver Function Tests: No results for input(s): AST, ALT, ALKPHOS, BILITOT, PROT, ALBUMIN in the last 168 hours. No results for input(s): LIPASE, AMYLASE in the last 168 hours. No results for input(s): AMMONIA in the last 168 hours.  CBC: Recent Labs  Lab 02/24/18 0650  WBC 10.6*  HGB 11.2*  HCT 38.3  MCV 93.9  PLT 262    Cardiac Enzymes: No results for input(s): CKTOTAL, CKMB, CKMBINDEX, TROPONINI in the last 168 hours.  BNP (last 3 results) No results for input(s): BNP in the last 8760 hours.  ProBNP (last 3 results) No results for input(s): PROBNP in the last 8760 hours.  Radiological Exams: No results  found.  Assessment/Plan Active Problems:   Acute on chronic respiratory failure with hypoxia (HCC)   Nontraumatic subarachnoid hemorrhage (HCC)   Chronic atrial fibrillation   Obstructive chronic bronchitis without exacerbation (HCC)   Dysphagia as late effect of cerebrovascular accident (CVA)   1. Acute on chronic respiratory failure hypoxia continue with T collar patient is weaning doing fairly well hopefully we should be able to try for capping and decannulation. 2. Chronic atrial fibrillation rate is controlled 3. COPD severe disease 4. Dysphagia at baseline 5. Nontraumatic intracranial hemorrhage no change noted   I have personally seen and evaluated the patient, evaluated laboratory and imaging results, formulated the assessment and plan and placed orders. The Patient requires high complexity decision making for assessment and support.  Case was discussed on Rounds with the Respiratory Therapy Staff  Yevonne PaxSaadat A Aaren Atallah, MD [facility] Pulmonary Critical Care Medicine Sleep Medicine

## 2018-03-01 DIAGNOSIS — I609 Nontraumatic subarachnoid hemorrhage, unspecified: Secondary | ICD-10-CM | POA: Diagnosis not present

## 2018-03-01 DIAGNOSIS — J9621 Acute and chronic respiratory failure with hypoxia: Secondary | ICD-10-CM | POA: Diagnosis not present

## 2018-03-01 DIAGNOSIS — I69391 Dysphagia following cerebral infarction: Secondary | ICD-10-CM | POA: Diagnosis not present

## 2018-03-01 DIAGNOSIS — I482 Chronic atrial fibrillation, unspecified: Secondary | ICD-10-CM | POA: Diagnosis not present

## 2018-03-01 LAB — BASIC METABOLIC PANEL
Anion gap: 9 (ref 5–15)
BUN: 19 mg/dL (ref 6–20)
CHLORIDE: 101 mmol/L (ref 98–111)
CO2: 31 mmol/L (ref 22–32)
Calcium: 9.1 mg/dL (ref 8.9–10.3)
Creatinine, Ser: 0.64 mg/dL (ref 0.44–1.00)
GFR calc Af Amer: >60 mL/min (ref >60)
GFR calc non Af Amer: >60 mL/min (ref >60)
Glucose, Bld: 137 mg/dL — ABNORMAL HIGH (ref 70–99)
Potassium: 4.1 mmol/L (ref 3.5–5.1)
SODIUM: 141 mmol/L (ref 135–145)

## 2018-03-01 LAB — CBC
HEMATOCRIT: 41 % (ref 36.0–46.0)
Hemoglobin: 12.6 g/dL (ref 12.0–15.0)
MCH: 28.6 pg (ref 26.0–34.0)
MCHC: 30.7 g/dL (ref 30.0–36.0)
MCV: 93.2 fL (ref 80.0–100.0)
Platelets: 215 10*3/uL (ref 150–400)
RBC: 4.4 MIL/uL (ref 3.87–5.11)
RDW: 15.4 % (ref 11.5–15.5)
WBC: 12.2 10*3/uL — AB (ref 4.0–10.5)
nRBC: 0 % (ref 0.0–0.2)

## 2018-03-01 NOTE — Progress Notes (Signed)
[facility]   PULMONARY CRITICAL CARE SERVICE  PROGRESS NOTE  Date of Service: 03/01/2018  Evelyn FreezeLinda J Wilson  ZOX:[id number]  DOB: 10/26/1960   DOA: 02/09/2018  Referring Physician: Carron CurieAli Hijazi, MD  HPI: Evelyn FreezeLinda J Wilson is a 57 y.o. female seen for follow up of Acute on Chronic Respiratory Failure.  She is on T collar at this time on room air has been tolerating PMV fairly well  Medications: Reviewed on Rounds  Physical Exam:  Vitals: Temperature 97.9 pulse 120 respiratory rate 15 blood pressure 112/48 saturations 100%  Ventilator Settings currently is on T collar FiO2 21%  . General: Comfortable at this time . Eyes: Grossly normal lids, irises & conjunctiva . ENT: grossly tongue is normal . Neck: no obvious mass . Cardiovascular: S1 S2 normal no gallop . Respiratory: No rhonchi or rales are noted . Abdomen: soft . Skin: no rash seen on limited exam . Musculoskeletal: not rigid . Psychiatric:unable to assess . Neurologic: no seizure no involuntary movements         Lab Data:   Basic Metabolic Panel: Recent Labs  Lab 02/24/18 0650 03/01/18 0445  NA 138 141  K 4.4 4.1  CL 98 101  CO2 31 31  GLUCOSE 156* 137*  BUN 14 19  CREATININE 0.54 0.64  CALCIUM 8.8* 9.1    ABG: No results for input(s): PHART, PCO2ART, PO2ART, HCO3, O2SAT in the last 168 hours.  Liver Function Tests: No results for input(s): AST, ALT, ALKPHOS, BILITOT, PROT, ALBUMIN in the last 168 hours. No results for input(s): LIPASE, AMYLASE in the last 168 hours. No results for input(s): AMMONIA in the last 168 hours.  CBC: Recent Labs  Lab 02/24/18 0650 03/01/18 0445  WBC 10.6* 12.2*  HGB 11.2* 12.6  HCT 38.3 41.0  MCV 93.9 93.2  PLT 262 215    Cardiac Enzymes: No results for input(s): CKTOTAL, CKMB, CKMBINDEX, TROPONINI in the last 168 hours.  BNP (last 3 results) No results for input(s): BNP in the last 8760 hours.  ProBNP (last 3  results) No results for input(s): PROBNP in the last 8760 hours.  Radiological Exams: No results found.  Assessment/Plan Active Problems:   Acute on chronic respiratory failure with hypoxia (HCC)   Nontraumatic subarachnoid hemorrhage (HCC)   Chronic atrial fibrillation   Obstructive chronic bronchitis without exacerbation (HCC)   Dysphagia as late effect of cerebrovascular accident (CVA)   1. Acute on chronic respiratory failure with hypoxia we will continue with the T collar trials at this time.  Patient is on room air with the PMV as noted. 2. Nontraumatic simple arachnoid hemorrhage at baseline continue present management 3. Atrial fibrillation rate is controlled we will continue to monitor 4. COPD at baseline 5. Dysphagia unchanged   I have personally seen and evaluated the patient, evaluated laboratory and imaging results, formulated the assessment and plan and placed orders. The Patient requires high complexity decision making for assessment and support.  Case was discussed on Rounds with the Respiratory Therapy Staff  Yevonne PaxSaadat A Khan, MD [facility] Pulmonary Critical Care Medicine Sleep Medicine

## 2018-03-02 ENCOUNTER — Other Ambulatory Visit (HOSPITAL_COMMUNITY): Payer: Medicare Other

## 2018-03-02 DIAGNOSIS — I609 Nontraumatic subarachnoid hemorrhage, unspecified: Secondary | ICD-10-CM | POA: Diagnosis not present

## 2018-03-02 DIAGNOSIS — I69391 Dysphagia following cerebral infarction: Secondary | ICD-10-CM | POA: Diagnosis not present

## 2018-03-02 DIAGNOSIS — J9621 Acute and chronic respiratory failure with hypoxia: Secondary | ICD-10-CM | POA: Diagnosis not present

## 2018-03-02 DIAGNOSIS — I482 Chronic atrial fibrillation, unspecified: Secondary | ICD-10-CM | POA: Diagnosis not present

## 2018-03-02 NOTE — Progress Notes (Signed)
[facility]ulmonary Critical [facility]   PULMONARY CRITICAL CARE SERVICE  PROGRESS NOTE  Date of Service: 03/02/2018  Evelyn FreezeLinda J Wilson  JWJ:[id number]  DOB: 03/13/1961   DOA: 02/09/2018  Referring Physician: Carron CurieAli Hijazi, MD  HPI: Evelyn FreezeLinda J Wilson is a 57 y.o. female seen for follow up of Acute on Chronic Respiratory Failure.  Patient right now is on T collar has been basically on room air now doing fairly well.  Medications: Reviewed on Rounds  Physical Exam:  Vitals: Temperature 98.8 pulse 90 respiratory rate 16 blood pressure 122/56 saturations 98%  Ventilator Settings off the ventilator on T collar trials  . General: Comfortable at this time . Eyes: Grossly normal lids, irises & conjunctiva . ENT: grossly tongue is normal . Neck: no obvious mass . Cardiovascular: S1 S2 normal no gallop . Respiratory: No rhonchi or rales are noted . Abdomen: soft . Skin: no rash seen on limited exam . Musculoskeletal: not rigid . Psychiatric:unable to assess . Neurologic: no seizure no involuntary movements         Lab Data:   Basic Metabolic Panel: Recent Labs  Lab 02/24/18 0650 03/01/18 0445  NA 138 141  K 4.4 4.1  CL 98 101  CO2 31 31  GLUCOSE 156* 137*  BUN 14 19  CREATININE 0.54 0.64  CALCIUM 8.8* 9.1    ABG: No results for input(s): PHART, PCO2ART, PO2ART, HCO3, O2SAT in the last 168 hours.  Liver Function Tests: No results for input(s): AST, ALT, ALKPHOS, BILITOT, PROT, ALBUMIN in the last 168 hours. No results for input(s): LIPASE, AMYLASE in the last 168 hours. No results for input(s): AMMONIA in the last 168 hours.  CBC: Recent Labs  Lab 02/24/18 0650 03/01/18 0445  WBC 10.6* 12.2*  HGB 11.2* 12.6  HCT 38.3 41.0  MCV 93.9 93.2  PLT 262 215    Cardiac Enzymes: No results for input(s): CKTOTAL, CKMB, CKMBINDEX, TROPONINI in the last 168 hours.  BNP (last 3 results) No results for input(s): BNP in the last 8760 hours.  ProBNP  (last 3 results) No results for input(s): PROBNP in the last 8760 hours.  Radiological Exams: Dg Chest Port 1 View  Result Date: 03/02/2018 CLINICAL DATA:  History of pneumonia. History of respiratory failure. EXAM: PORTABLE CHEST 1 VIEW COMPARISON:  Chest x-ray 02/09/2018. FINDINGS: Tracheostomy tube noted with tip 3 cm above the carina. Cardiac monitoring device noted over the chest. Heart size normal. Mild bibasilar subsegmental atelectasis. No pleural effusion or pneumothorax. No acute bony abnormality. Surgical clips right upper quadrant. IMPRESSION: 1. Tracheostomy tube noted with tip 3 cm above the carina. Cardiac monitor device noted over the chest. 2.  Mild bibasilar subsegmental atelectasis. Electronically Signed   By: Maisie Fushomas  Register   On: 03/02/2018 13:52    Assessment/Plan Active Problems:   Acute on chronic respiratory failure with hypoxia (HCC)   Nontraumatic subarachnoid hemorrhage (HCC)   Chronic atrial fibrillation   Obstructive chronic bronchitis without exacerbation (HCC)   Dysphagia as late effect of cerebrovascular accident (CVA)   1. Acute on chronic respiratory failure with hypoxia we will continue with T collar continue secretion management pulmonary toilet. 2. Nontraumatic subarachnoid hemorrhage patient's at baseline 3. Chronic atrial fibrillation rate is controlled 4. COPD at baseline 5. Dysphagia stable at this time   I have personally seen and evaluated the patient, evaluated laboratory and imaging results, formulated the assessment and plan and placed orders. The Patient requires high complexity decision making for  assessment and support.  Case was discussed on Rounds with the Respiratory Therapy Staff  Allyne Gee, MD [facility] Pulmonary Critical Care Medicine Sleep Medicine

## 2018-03-03 DIAGNOSIS — I482 Chronic atrial fibrillation, unspecified: Secondary | ICD-10-CM | POA: Diagnosis not present

## 2018-03-03 DIAGNOSIS — J9621 Acute and chronic respiratory failure with hypoxia: Secondary | ICD-10-CM | POA: Diagnosis not present

## 2018-03-03 DIAGNOSIS — I69391 Dysphagia following cerebral infarction: Secondary | ICD-10-CM | POA: Diagnosis not present

## 2018-03-03 DIAGNOSIS — I609 Nontraumatic subarachnoid hemorrhage, unspecified: Secondary | ICD-10-CM | POA: Diagnosis not present

## 2018-03-03 NOTE — Progress Notes (Signed)
[facility]   PULMONARY CRITICAL CARE SERVICE  PROGRESS NOTE  Date of Service: 03/03/2018  Evelyn FreezeLinda J Wilson  EAV:[id number]  DOB: 05/02/1960   DOA: 02/09/2018  Referring Physician: Carron CurieAli Hijazi, MD  HPI: Evelyn FreezeLinda J Wilson is a 57 y.o. female seen for follow up of Acute on Chronic Respiratory Failure.  Patient is capping doing relatively well.  Medications: Reviewed on Rounds  Physical Exam:  Vitals: Temperature 98.1 pulse 69 respiratory rate 18 blood pressure 131/62 saturation 97%  Ventilator Settings capping off the ventilator  . General: Comfortable at this time . Eyes: Grossly normal lids, irises & conjunctiva . ENT: grossly tongue is normal . Neck: no obvious mass . Cardiovascular: S1 S2 normal no gallop . Respiratory: No rhonchi no rales . Abdomen: soft . Skin: no rash seen on limited exam . Musculoskeletal: not rigid . Psychiatric:unable to assess . Neurologic: no seizure no involuntary movements         Lab Data:   Basic Metabolic Panel: Recent Labs  Lab 03/01/18 0445  NA 141  K 4.1  CL 101  CO2 31  GLUCOSE 137*  BUN 19  CREATININE 0.64  CALCIUM 9.1    ABG: No results for input(s): PHART, PCO2ART, PO2ART, HCO3, O2SAT in the last 168 hours.  Liver Function Tests: No results for input(s): AST, ALT, ALKPHOS, BILITOT, PROT, ALBUMIN in the last 168 hours. No results for input(s): LIPASE, AMYLASE in the last 168 hours. No results for input(s): AMMONIA in the last 168 hours.  CBC: Recent Labs  Lab 03/01/18 0445  WBC 12.2*  HGB 12.6  HCT 41.0  MCV 93.2  PLT 215    Cardiac Enzymes: No results for input(s): CKTOTAL, CKMB, CKMBINDEX, TROPONINI in the last 168 hours.  BNP (last 3 results) No results for input(s): BNP in the last 8760 hours.  ProBNP (last 3 results) No results for input(s): PROBNP in the last 8760 hours.  Radiological Exams: Dg Chest Port 1 View  Result Date: 03/02/2018 CLINICAL  DATA:  History of pneumonia. History of respiratory failure. EXAM: PORTABLE CHEST 1 VIEW COMPARISON:  Chest x-ray 02/09/2018. FINDINGS: Tracheostomy tube noted with tip 3 cm above the carina. Cardiac monitoring device noted over the chest. Heart size normal. Mild bibasilar subsegmental atelectasis. No pleural effusion or pneumothorax. No acute bony abnormality. Surgical clips right upper quadrant. IMPRESSION: 1. Tracheostomy tube noted with tip 3 cm above the carina. Cardiac monitor device noted over the chest. 2.  Mild bibasilar subsegmental atelectasis. Electronically Signed   By: Maisie Fushomas  Register   On: 03/02/2018 13:52    Assessment/Plan Active Problems:   Acute on chronic respiratory failure with hypoxia (HCC)   Nontraumatic subarachnoid hemorrhage (HCC)   Chronic atrial fibrillation   Obstructive chronic bronchitis without exacerbation (HCC)   Dysphagia as late effect of cerebrovascular accident (CVA)   1. Acute on chronic respiratory failure with hypoxia we will continue with capping trials continue pulmonary toilet supportive care 2. Nontraumatic subarachnoid hemorrhage at baseline we will continue to follow 3. Chronic atrial fibrillation rate is controlled 4. COPD advanced disease 5. Dysphagia stable   I have personally seen and evaluated the patient, evaluated laboratory and imaging results, formulated the assessment and plan and placed orders. The Patient requires high complexity decision making for assessment and support.  Case was discussed on Rounds with the Respiratory Therapy Staff  Yevonne PaxSaadat A Sherlyn Ebbert, MD [facility] Pulmonary Critical Care Medicine Sleep Medicine

## 2018-03-04 DIAGNOSIS — J9621 Acute and chronic respiratory failure with hypoxia: Secondary | ICD-10-CM | POA: Diagnosis not present

## 2018-03-04 DIAGNOSIS — I482 Chronic atrial fibrillation, unspecified: Secondary | ICD-10-CM | POA: Diagnosis not present

## 2018-03-04 DIAGNOSIS — I609 Nontraumatic subarachnoid hemorrhage, unspecified: Secondary | ICD-10-CM | POA: Diagnosis not present

## 2018-03-04 DIAGNOSIS — I69391 Dysphagia following cerebral infarction: Secondary | ICD-10-CM | POA: Diagnosis not present

## 2018-03-04 NOTE — Progress Notes (Signed)
[facility]   PULMONARY CRITICAL CARE SERVICE  PROGRESS NOTE  Date of Service: 03/04/2018  Evelyn Wilson  NWG:[id number]  DOB: 04/13/1960   DOA: 02/09/2018  Referring Physician: Carron CurieAli Hijazi, MD  HPI: Evelyn Wilson is a 57 y.o. female seen for follow up of Acute on Chronic Respiratory Failure.  She is doing about the same comfortable right now without distress  Medications: Reviewed on Rounds  Physical Exam:  Vitals: Temperature 97.8 pulse 104 respiratory rate 17 blood pressure 136/59 saturations are 100%  Ventilator Settings T collar trials  . General: Comfortable at this time . Eyes: Grossly normal lids, irises & conjunctiva . ENT: grossly tongue is normal . Neck: no obvious mass . Cardiovascular: S1 S2 normal no gallop . Respiratory: No rhonchi coarse breath sounds . Abdomen: soft . Skin: no rash seen on limited exam . Musculoskeletal: not rigid . Psychiatric:unable to assess . Neurologic: no seizure no involuntary movements         Lab Data:   Basic Metabolic Panel: Recent Labs  Lab 03/01/18 0445  NA 141  K 4.1  CL 101  CO2 31  GLUCOSE 137*  BUN 19  CREATININE 0.64  CALCIUM 9.1    ABG: No results for input(s): PHART, PCO2ART, PO2ART, HCO3, O2SAT in the last 168 hours.  Liver Function Tests: No results for input(s): AST, ALT, ALKPHOS, BILITOT, PROT, ALBUMIN in the last 168 hours. No results for input(s): LIPASE, AMYLASE in the last 168 hours. No results for input(s): AMMONIA in the last 168 hours.  CBC: Recent Labs  Lab 03/01/18 0445  WBC 12.2*  HGB 12.6  HCT 41.0  MCV 93.2  PLT 215    Cardiac Enzymes: No results for input(s): CKTOTAL, CKMB, CKMBINDEX, TROPONINI in the last 168 hours.  BNP (last 3 results) No results for input(s): BNP in the last 8760 hours.  ProBNP (last 3 results) No results for input(s): PROBNP in the last 8760 hours.  Radiological Exams: No results  found.  Assessment/Plan Active Problems:   Acute on chronic respiratory failure with hypoxia (HCC)   Nontraumatic subarachnoid hemorrhage (HCC)   Chronic atrial fibrillation   Obstructive chronic bronchitis without exacerbation (HCC)   Dysphagia as late effect of cerebrovascular accident (CVA)   1. Acute on chronic respiratory failure with hypoxia patient will continue with the T collar trials as tolerated. 2. Nontraumatic subarachnoid hemorrhage at baseline 3. Chronic atrial fibrillation rate is controlled 4. COPD severe disease continue present management 5. Dysphagia stable   I have personally seen and evaluated the patient, evaluated laboratory and imaging results, formulated the assessment and plan and placed orders. The Patient requires high complexity decision making for assessment and support.  Case was discussed on Rounds with the Respiratory Therapy Staff  Yevonne PaxSaadat A Jeilyn Reznik, MD [facility] Pulmonary Critical Care Medicine Sleep Medicine

## 2018-03-05 DIAGNOSIS — I482 Chronic atrial fibrillation, unspecified: Secondary | ICD-10-CM | POA: Diagnosis not present

## 2018-03-05 DIAGNOSIS — J9621 Acute and chronic respiratory failure with hypoxia: Secondary | ICD-10-CM | POA: Diagnosis not present

## 2018-03-05 DIAGNOSIS — I69391 Dysphagia following cerebral infarction: Secondary | ICD-10-CM | POA: Diagnosis not present

## 2018-03-05 DIAGNOSIS — I609 Nontraumatic subarachnoid hemorrhage, unspecified: Secondary | ICD-10-CM | POA: Diagnosis not present

## 2018-03-05 NOTE — Progress Notes (Signed)
[facility]   PULMONARY CRITICAL CARE SERVICE  PROGRESS NOTE  Date of Service: 03/05/2018  Evelyn Wilson  WGN:[id number]  DOB: 09/20/1960   DOA: 02/09/2018  Referring Physician: Carron CurieAli Hijazi, MD  HPI: Evelyn Wilson is a 57 y.o. female seen for follow up of Acute on Chronic Respiratory Failure.  Patient is doing very well she is resting comfortably with no distress.  Patient is on day 4 of capping.  Discussed decannulation with patient and daughter at bedside.  They are willing to proceed with decannulation at this time.  We did discuss the possibility that if the patient decompensates after decannulation she would be required to be reintubated with an endotracheal tube.  They verbalized understanding.  Medications: Reviewed on Rounds  Physical Exam:  Vitals: Temperature 98.6 pulse 88 respiration 17 blood pressure 116/54 O2 saturation 97%.  Ventilator Settings patient not currently on ventilator.  Patient is on room air.  . General: Comfortable at this time . Eyes: Grossly normal lids, irises & conjunctiva . ENT: grossly tongue is normal . Neck: no obvious mass . Cardiovascular: S1 S2 normal no gallop . Respiratory: No wheezes or rhonchi noted. . Abdomen: soft . Skin: no rash seen on limited exam . Musculoskeletal: not rigid . Psychiatric:unable to assess . Neurologic: no seizure no involuntary movements         Lab Data:   Basic Metabolic Panel: Recent Labs  Lab 03/01/18 0445  NA 141  K 4.1  CL 101  CO2 31  GLUCOSE 137*  BUN 19  CREATININE 0.64  CALCIUM 9.1    ABG: No results for input(s): PHART, PCO2ART, PO2ART, HCO3, O2SAT in the last 168 hours.  Liver Function Tests: No results for input(s): AST, ALT, ALKPHOS, BILITOT, PROT, ALBUMIN in the last 168 hours. No results for input(s): LIPASE, AMYLASE in the last 168 hours. No results for input(s): AMMONIA in the last 168 hours.  CBC: Recent Labs  Lab  03/01/18 0445  WBC 12.2*  HGB 12.6  HCT 41.0  MCV 93.2  PLT 215    Cardiac Enzymes: No results for input(s): CKTOTAL, CKMB, CKMBINDEX, TROPONINI in the last 168 hours.  BNP (last 3 results) No results for input(s): BNP in the last 8760 hours.  ProBNP (last 3 results) No results for input(s): PROBNP in the last 8760 hours.  Radiological Exams: No results found.  Assessment/Plan Active Problems:   Acute on chronic respiratory failure with hypoxia (HCC)   Nontraumatic subarachnoid hemorrhage (HCC)   Chronic atrial fibrillation   Obstructive chronic bronchitis without exacerbation (HCC)   Dysphagia as late effect of cerebrovascular accident (CVA)   1. Acute on chronic respiratory failure with hypoxia patient is on day 4 of capping, will decannulate patient today. 2.  Nontraumatic subarachnoid hemorrhage at baseline. 3.  Chronic atrial fibrillation rate is currently controlled. 4.  COPD severe disease, continue supportive care. 5.  Dysphagia, stable.  Patient will have speech evaluation tomorrow.  I have personally seen and evaluated the patient, evaluated laboratory and imaging results, formulated the assessment and plan and placed orders. The Patient requires high complexity decision making for assessment and support.  Case was discussed on Rounds with the Respiratory Therapy Staff  Yevonne PaxSaadat A , MD [facility] Pulmonary Critical Care Medicine Sleep Medicine

## 2018-03-07 ENCOUNTER — Other Ambulatory Visit (HOSPITAL_COMMUNITY): Payer: Medicare Other

## 2018-03-07 LAB — BASIC METABOLIC PANEL
Anion gap: 12 (ref 5–15)
BUN: 17 mg/dL (ref 6–20)
CHLORIDE: 99 mmol/L (ref 98–111)
CO2: 26 mmol/L (ref 22–32)
CREATININE: 0.48 mg/dL (ref 0.44–1.00)
Calcium: 9.1 mg/dL (ref 8.9–10.3)
GFR calc Af Amer: >60 mL/min (ref >60)
GFR calc non Af Amer: >60 mL/min (ref >60)
GLUCOSE: 152 mg/dL — AB (ref 70–99)
POTASSIUM: 4.2 mmol/L (ref 3.5–5.1)
SODIUM: 137 mmol/L (ref 135–145)

## 2018-03-07 LAB — URINALYSIS, ROUTINE W REFLEX MICROSCOPIC
Bilirubin Urine: NEGATIVE
GLUCOSE, UA: NEGATIVE mg/dL
HGB URINE DIPSTICK: NEGATIVE
KETONES UR: NEGATIVE mg/dL
Leukocytes, UA: NEGATIVE
Nitrite: NEGATIVE
PH: 6 (ref 5.0–8.0)
PROTEIN: NEGATIVE mg/dL
Specific Gravity, Urine: 1.017 (ref 1.005–1.030)

## 2018-03-07 LAB — CBC
HCT: 42.1 % (ref 36.0–46.0)
HEMOGLOBIN: 12.7 g/dL (ref 12.0–15.0)
MCH: 28.2 pg (ref 26.0–34.0)
MCHC: 30.2 g/dL (ref 30.0–36.0)
MCV: 93.3 fL (ref 80.0–100.0)
PLATELETS: 259 10*3/uL (ref 150–400)
RBC: 4.51 MIL/uL (ref 3.87–5.11)
RDW: 15.7 % — ABNORMAL HIGH (ref 11.5–15.5)
WBC: 11.8 10*3/uL — ABNORMAL HIGH (ref 4.0–10.5)
nRBC: 0 % (ref 0.0–0.2)

## 2019-06-28 IMAGING — DX DG CHEST 1V PORT
1 series · 1 of 1 positions shown · non-contrast
Comparison: Chest x-ray 02/09/2018.

CLINICAL DATA: History of pneumonia. History of respiratory
failure.

EXAM:
PORTABLE CHEST 1 VIEW

[chest ap]
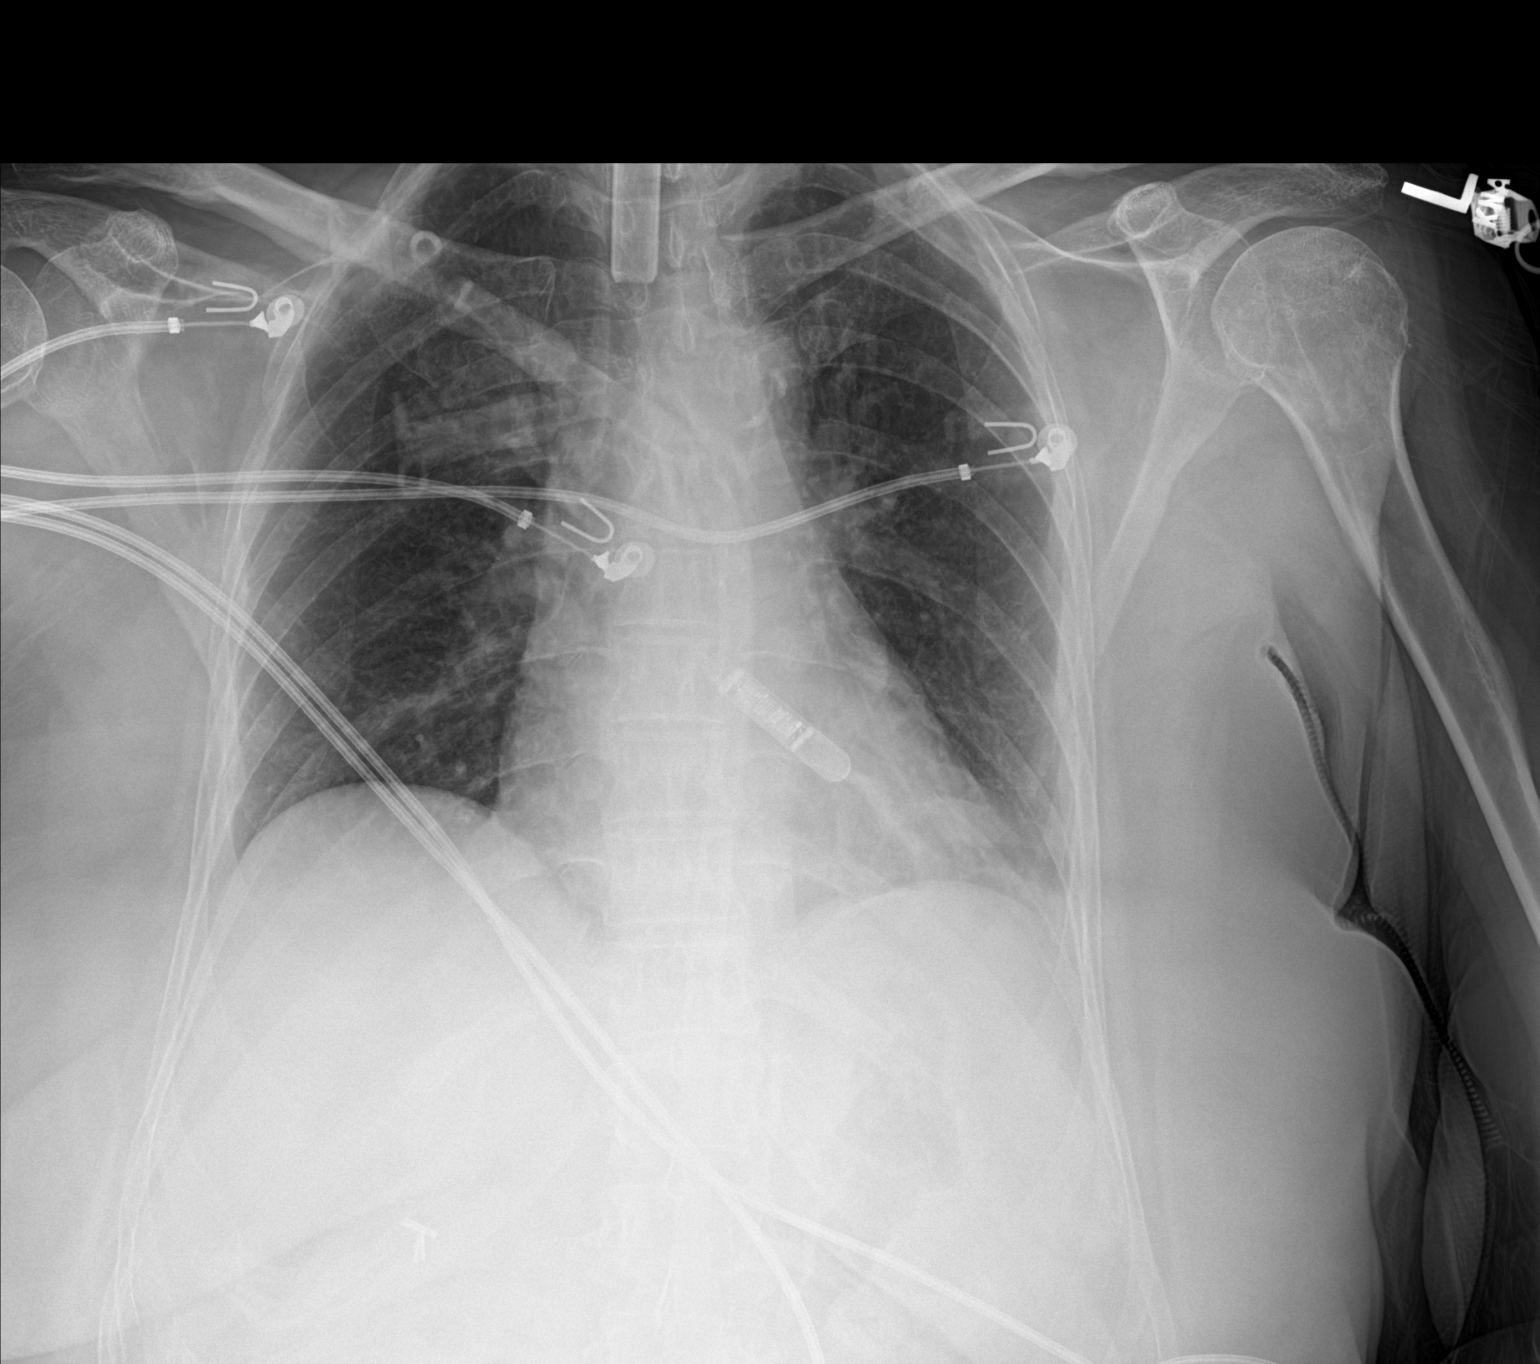

[1 of 1 positions shown; findings below may reference images not displayed]

FINDINGS: Tracheostomy tube noted with tip 3 cm above the carina. Cardiac
monitoring device noted over the chest. Heart size normal. Mild
bibasilar subsegmental atelectasis. No pleural effusion or
pneumothorax. No acute bony abnormality. Surgical clips right upper
quadrant.
IMPRESSION: 1. Tracheostomy tube noted with tip 3 cm above the carina. Cardiac
monitor device noted over the chest.

2.  Mild bibasilar subsegmental atelectasis.

## 2019-07-03 IMAGING — CT CT HEAD W/O CM
4 series · 16 of 47 positions shown, 18 images · non-contrast
Comparison: None.

CLINICAL DATA: 57 y/o  F; Ataxia, stroke suspected.

EXAM:
CT HEAD WITHOUT CONTRAST
TECHNIQUE: Contiguous axial images were obtained from the base of the skull
through the vertex without intravenous contrast.

[Series 3: head wo · axial · 0.42mm/px · z∈[-166,-36]mm · 7 of 36 slices shown, 9 images]
[im 5/36  brain]
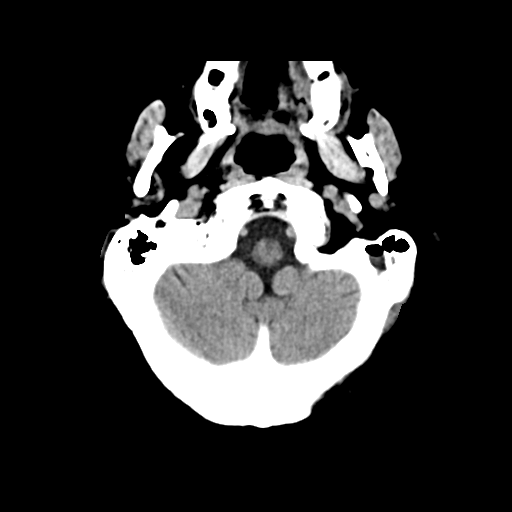
[im 5/36  bone]
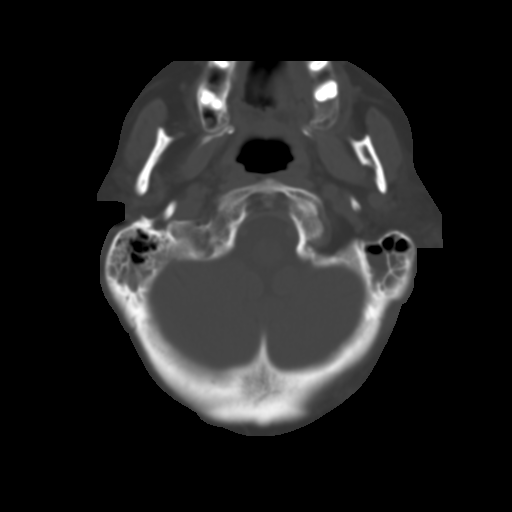
[im 9/36  brain]
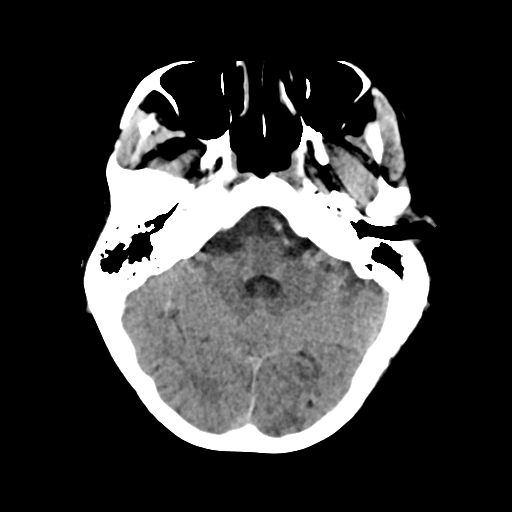
[im 14/36  brain]
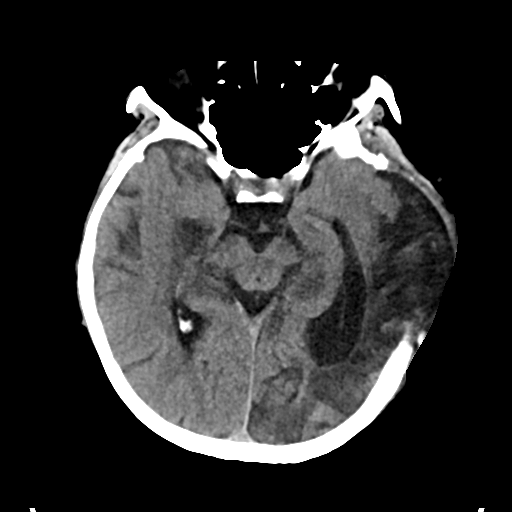
[im 18/36  brain]
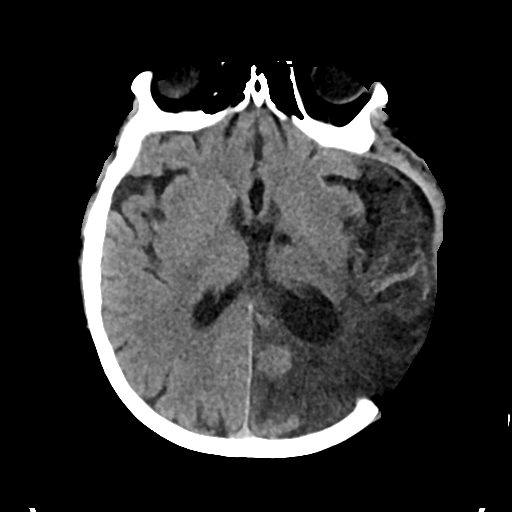
[im 22/36  brain]
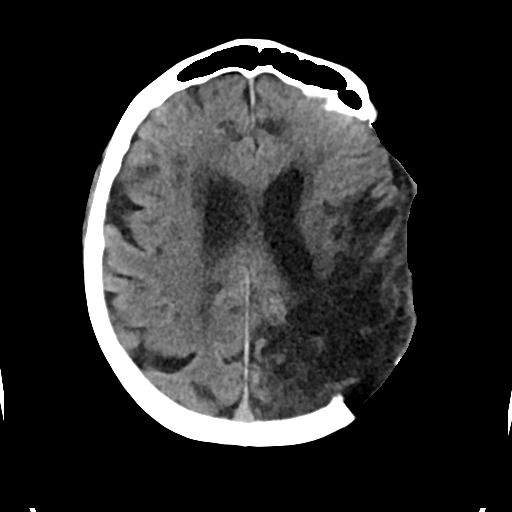
[im 22/36  bone]
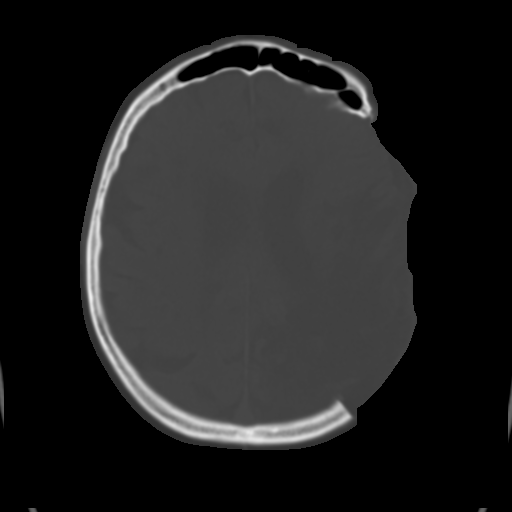
[im 27/36  brain]
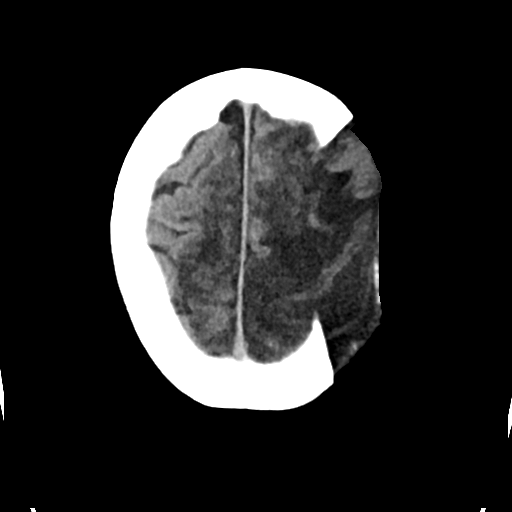
[im 31/36  brain]
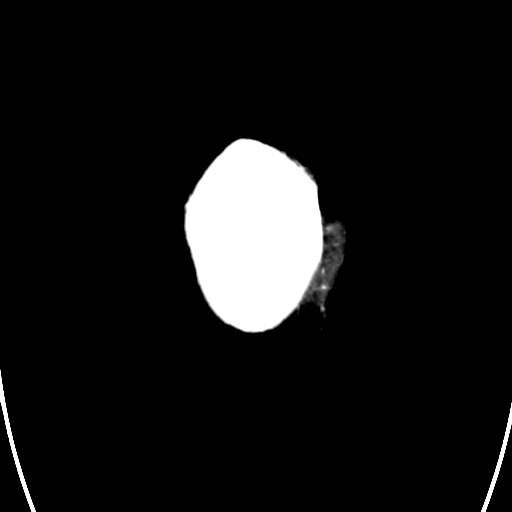

[Series 4: head bone · axial · 0.42mm/px · z∈[-170,-134]mm · 3 of 89 slices shown]
[im 9/89  bone]
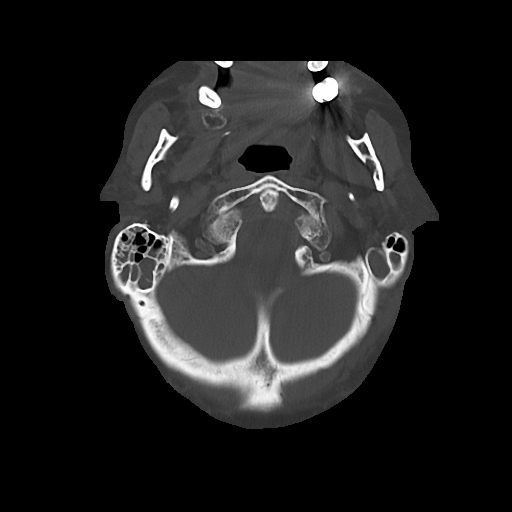
[im 18/89  bone]
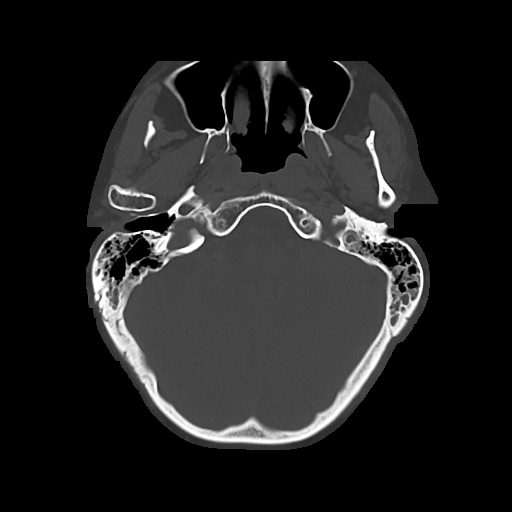
[im 27/89  bone]
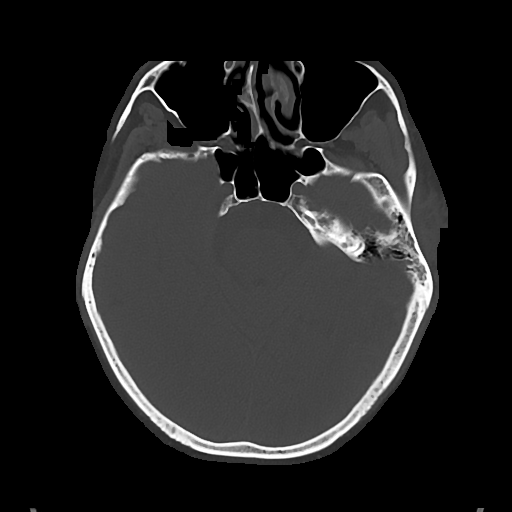

[Series 5: cor soft · coronal · 0.34mm/px · 3 of 67 slices shown]
[im 23/67  brain]
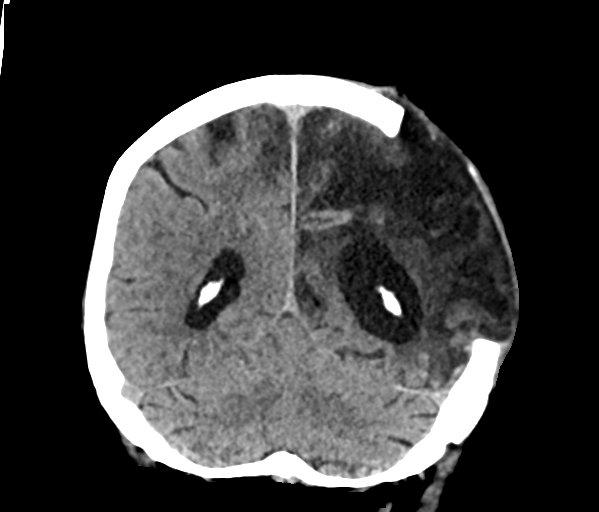
[im 30/67  brain]
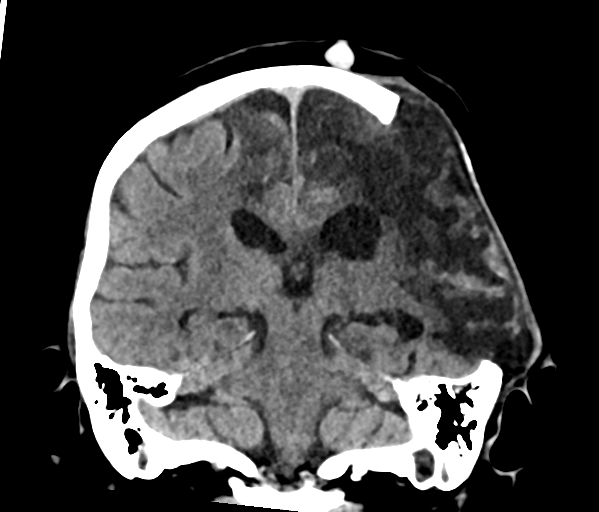
[im 37/67  brain]
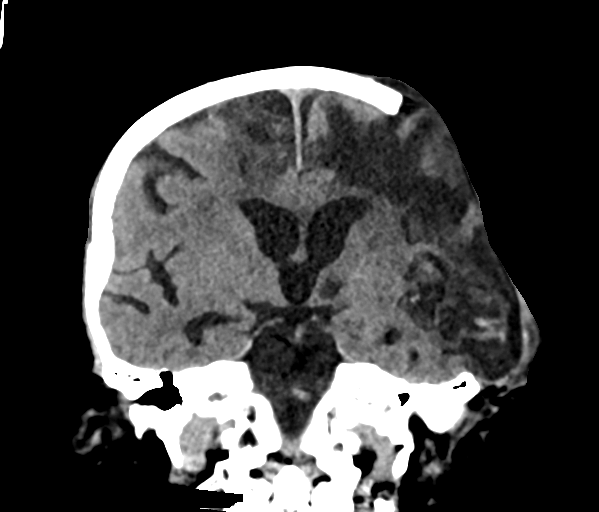

[Series 6: sag soft · sagittal · 0.34mm/px · 3 of 60 slices shown]
[im 20/60  brain]
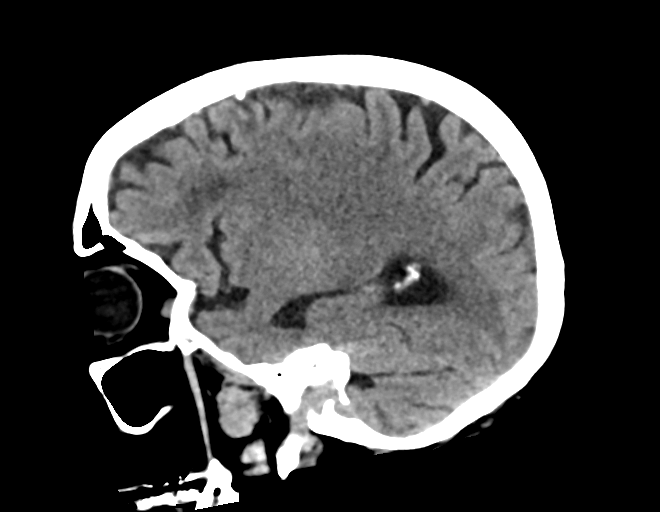
[im 30/60  brain]
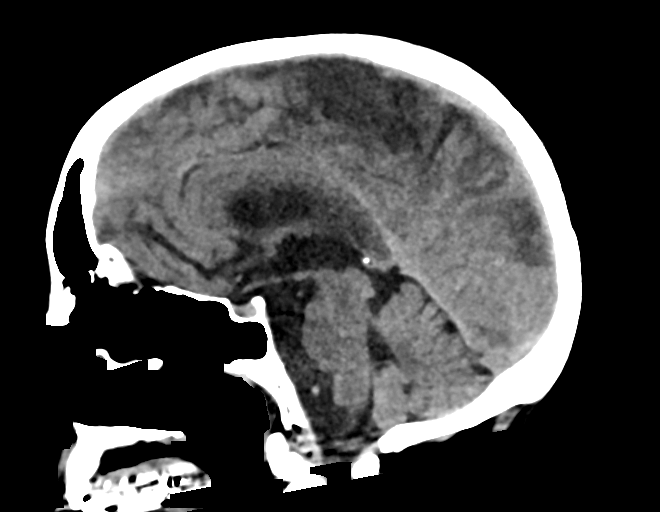
[im 40/60  brain]
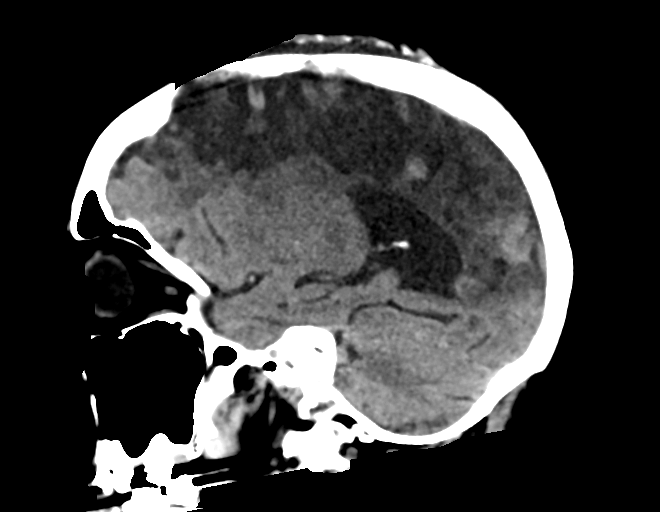

[16 of 47 positions shown; findings below may reference images not displayed]

FINDINGS: Brain: Multi vascular territory chronic infarction involving the
left MCA, bilateral ACA, and partial left PCA territories of the
supratentorial brain. Mass effect related to the infarct in the left
cerebral hemisphere results in herniation of the brain via the
craniectomy defect up to 17 mm (series 5, image 37). No acute
hemorrhage, hydrocephalus, or herniation. No midline shift.

Vascular: No hyperdense vessel or unexpected calcification.

Skull: Large left lateral craniectomy.

Sinuses/Orbits: Partial bilateral mastoid air cell opacification.
Normal aeration of paranasal sinuses. Orbits are unremarkable.

Other: None.
IMPRESSION: 1. Multi vascular territory chronic infarction involving left MCA,
bilateral ACA, and partial left PCA territories.
2. No acute process identified.
